# Patient Record
Sex: Female | Born: 1975 | Race: White | Hispanic: No | Marital: Single | Smoking: Current every day smoker
Health system: Southern US, Community
[De-identification: ages and names within clinical notes are randomized; demographics above are authoritative.]

## PROBLEM LIST (undated history)

## (undated) DIAGNOSIS — E282 Polycystic ovarian syndrome: Secondary | ICD-10-CM

## (undated) DIAGNOSIS — R87629 Unspecified abnormal cytological findings in specimens from vagina: Secondary | ICD-10-CM

## (undated) HISTORY — DX: Polycystic ovarian syndrome: E28.2

## (undated) HISTORY — DX: Unspecified abnormal cytological findings in specimens from vagina: R87.629

---

## 1998-08-23 HISTORY — PX: GALLBLADDER SURGERY: SHX652

## 1998-08-23 HISTORY — PX: CHOLECYSTECTOMY: SHX55

## 2012-08-23 HISTORY — PX: HX GASTRIC BYPASS: SHX52

## 2012-08-23 HISTORY — PX: GASTRIC BYPASS: SHX52

## 2019-02-07 ENCOUNTER — Telehealth: Payer: Self-pay | Admitting: General Practice

## 2019-02-07 NOTE — Telephone Encounter (Signed)
Left voicemail for pt to call us back and set up new patient appt.   Copied from [place] [phone]. Topic: Appointment Scheduling - New Patient >> Feb 06, 2019  4:52 PM Yvette Rack wrote: New patient would like to be scheduled for your office. Provider: Pt did not request a specific provider but prefers to establish with a female provider. Pt requests call back to schedule

## 2019-02-14 ENCOUNTER — Ambulatory Visit (INDEPENDENT_AMBULATORY_CARE_PROVIDER_SITE_OTHER): Payer: PRIVATE HEALTH INSURANCE | Admitting: Family Medicine

## 2019-02-14 ENCOUNTER — Encounter (INDEPENDENT_AMBULATORY_CARE_PROVIDER_SITE_OTHER): Payer: Self-pay

## 2019-02-14 ENCOUNTER — Ambulatory Visit (INDEPENDENT_AMBULATORY_CARE_PROVIDER_SITE_OTHER)
Admission: RE | Admit: 2019-02-14 | Discharge: 2019-02-14 | Disposition: A | Discharge status: Home / Self Care | Payer: PRIVATE HEALTH INSURANCE | Source: Ambulatory Visit | Attending: Family Medicine | Admitting: Family Medicine

## 2019-02-14 ENCOUNTER — Other Ambulatory Visit: Payer: Self-pay

## 2019-02-14 ENCOUNTER — Encounter: Payer: Self-pay | Admitting: Family Medicine

## 2019-02-14 VITALS — BP 110/58 | HR 74 | Temp 98.1°F | Resp 16 | Ht 69.0 in | Wt 171.0 lb

## 2019-02-14 DIAGNOSIS — M25531 Pain in right wrist: Secondary | ICD-10-CM | POA: Diagnosis not present

## 2019-02-14 DIAGNOSIS — Z9884 Bariatric surgery status: Secondary | ICD-10-CM | POA: Diagnosis not present

## 2019-02-14 DIAGNOSIS — F411 Generalized anxiety disorder: Secondary | ICD-10-CM | POA: Diagnosis not present

## 2019-02-14 DIAGNOSIS — E282 Polycystic ovarian syndrome: Secondary | ICD-10-CM

## 2019-02-14 DIAGNOSIS — F410 Panic disorder [episodic paroxysmal anxiety] without agoraphobia: Secondary | ICD-10-CM

## 2019-02-14 MED ORDER — SERTRALINE HCL 25 MG PO TABS: 25.0000 mg | ORAL_TABLET | Freq: Every day | ORAL | 3 refills | Status: DC

## 2019-02-14 MED ORDER — SERTRALINE HCL 100 MG PO TABS: 100.0000 mg | ORAL_TABLET | Freq: Every day | ORAL | 3 refills | Status: DC

## 2019-02-14 NOTE — Progress Notes (Signed)
Subjective:     Madeline Cannon is a 43 y.o. female presenting for New Patient Estab Care (No previous PCP or gynecologist/Last Td or Tdap (unsure [phone])2014), Right arm/hand pain numbness (No previous fall injury but ? carpal Tunnel syndrome), and Routine lab/vitamin levels (Hx of gastric bypass in 2014)     HPI   #Anxiety - has been on zoloft for a long time - started for anxiety/panic disorder - feels like things are working pretty well  #tobacco use - down to 1/2 ppd - not interested in quitting - got up to 1 ppd for a few years  #Right arm/hand numbness - digit 3-5 are numb constantly - 2nd digit is swollen and has difficulty bending the finger, has some pain in the finger - feels numbness up til her arm - thinks it may be from working on the computer - started with the 2nd digit pain - feels like it is getting worse with time - puffy and pain with palm area - treatments: soaking in hot water and epson salt - pain medication is limited due to gastric bypass surgery - elevating the arm - no other repetitive motions - can be worse in the morning - no neck or shoulder pain - has noticed some grip strength issues   Review of Systems See HPI  Social History   Tobacco Use  Smoking Status Current Every Day Smoker  . Packs/day: 0.50  . Years: 20.00  . Pack years: 10.00  . Types: Cigarettes  . Start date: 02/13/1997  Smokeless Tobacco Never Used        Objective:    BP Readings from Last 3 Encounters:  02/14/19 (!) 110/58   Wt Readings from Last 3 Encounters:  02/14/19 171 lb (77.6 kg)    BP (!) 110/58 (BP Location: Right Arm, Patient Position: Sitting, Cuff Size: Normal)   Pulse 74   Temp 98.1 F (36.7 C) (Oral)   Resp 16   Ht 5\' 9"  (1.753 m)   Wt 171 lb (77.6 kg)   LMP 02/13/2019 (Exact Date)   SpO2 98%   BMI 25.25 kg/m    Physical Exam Constitutional:      General: She is not in acute distress.    Appearance: She is well-developed. She is  not diaphoretic.  HENT:     Right Ear: External ear normal.     Left Ear: External ear normal.     Nose: Nose normal.  Eyes:     Conjunctiva/sclera: Conjunctivae normal.  Neck:     Musculoskeletal: Neck supple.  Cardiovascular:     Rate and Rhythm: Normal rate.  Pulmonary:     Effort: Pulmonary effort is normal.  Musculoskeletal:     Comments: Right Hand:  Inspection: swelling over the 2nd digit, no rash, no bruising Palpation: TTP over the 2nd digit, mild TTP over the wrist ROM: restricted over the 2nd digit with flexion, otherwise normal but some discomfort with wrist flexion Strength: decreased grip, normal otherwise.  Special: negative elbow tinel sign, some numbness with phalens testing, no numbness with wrist tinel sign.   Skin:    General: Skin is warm and dry.     Capillary Refill: Capillary refill takes less than 2 seconds.  Neurological:     Mental Status: She is alert. Mental status is at baseline.  Psychiatric:        Mood and Affect: Mood normal.        Behavior: Behavior normal.  XR Right hand: no obvious fractures. Possible joint space narrowing. Will f/u final read     Assessment & Plan:   Problem List Items Addressed This Visit      Endocrine   PCOS (polycystic ovarian syndrome)     Other   Generalized anxiety disorder with panic attacks   Relevant Medications   sertraline (ZOLOFT) 100 MG tablet   sertraline (ZOLOFT) 25 MG tablet   Hx of gastric bypass    Other Visit Diagnoses    Right wrist pain    -  Primary   Relevant Orders   DG Hand Complete Right   Ambulatory referral to Physical Therapy       Return in about 6 weeks (around 03/28/2019), or if symptoms worsen or fail to improve, for Dr. Lorelei Pont if not improved.  Lesleigh Noe, MD

## 2019-02-14 NOTE — Assessment & Plan Note (Signed)
Exam without clear diagnosis. Wonder about a combination of arthritis and possible nerve impingement. Unable to illicit elbow nerve signs though the numbness would be more consistent. Discussed trial of brace at night to see if carpal tunnel contributing will improve as well as PT referral given severity of discomfort of the 2nd digit and duration of symptoms. XR obtained given duration of symptoms, though initial review and history lower suspicion for fracture. Will f/u final read.

## 2019-02-14 NOTE — Patient Instructions (Signed)
#  Wrist pain - get a brace to wear at night  - see if this helps with the numbness  - refer to physical therapy

## 2019-02-14 NOTE — Assessment & Plan Note (Signed)
Will obtain outside records and labs to help determine what future monitoring she needs.

## 2019-02-14 NOTE — Assessment & Plan Note (Signed)
Doing well on zoloft, refills provided.

## 2019-02-15 ENCOUNTER — Other Ambulatory Visit: Payer: Self-pay | Admitting: Family Medicine

## 2019-02-15 DIAGNOSIS — Z9884 Bariatric surgery status: Secondary | ICD-10-CM

## 2019-02-15 DIAGNOSIS — Z114 Encounter for screening for human immunodeficiency virus [HIV]: Secondary | ICD-10-CM

## 2019-02-15 DIAGNOSIS — Z13220 Encounter for screening for lipoid disorders: Secondary | ICD-10-CM

## 2019-02-15 DIAGNOSIS — Z8639 Personal history of other endocrine, nutritional and metabolic disease: Secondary | ICD-10-CM

## 2019-02-15 NOTE — Progress Notes (Signed)
Received outside records from the surgeon. Ordered screening labs.   Will also check diabetes test and cholesterol as general screening. And ordered HIV as discussed with patient.   Can schedule lab appointment for blood work when able.

## 2019-02-16 NOTE — Progress Notes (Signed)
Patient advised and appointment made 

## 2019-02-21 NOTE — Addendum Note (Signed)
Addended by: Waunita Schooner R on: 02/21/2019 12:47 PM   Modules accepted: Orders

## 2019-02-22 ENCOUNTER — Other Ambulatory Visit (INDEPENDENT_AMBULATORY_CARE_PROVIDER_SITE_OTHER): Payer: PRIVATE HEALTH INSURANCE

## 2019-02-22 ENCOUNTER — Other Ambulatory Visit: Payer: Self-pay

## 2019-02-22 ENCOUNTER — Other Ambulatory Visit: Payer: PRIVATE HEALTH INSURANCE

## 2019-02-22 DIAGNOSIS — Z13220 Encounter for screening for lipoid disorders: Secondary | ICD-10-CM

## 2019-02-22 DIAGNOSIS — Z8639 Personal history of other endocrine, nutritional and metabolic disease: Secondary | ICD-10-CM

## 2019-02-22 DIAGNOSIS — Z9884 Bariatric surgery status: Secondary | ICD-10-CM | POA: Diagnosis not present

## 2019-02-22 DIAGNOSIS — Z114 Encounter for screening for human immunodeficiency virus [HIV]: Secondary | ICD-10-CM

## 2019-02-22 LAB — IRON: Iron: 8 ug/dL — ABNORMAL LOW (ref 42–145)

## 2019-02-22 LAB — COMPREHENSIVE METABOLIC PANEL
ALT: 4 U/L (ref 0–35)
AST: 9 U/L (ref 0–37)
Albumin: 3.9 g/dL (ref 3.5–5.2)
Alkaline Phosphatase: 72 U/L (ref 39–117)
BUN: 10 mg/dL (ref 6–23)
CO2: 27 mEq/L (ref 19–32)
Calcium: 8.5 mg/dL (ref 8.4–10.5)
Chloride: 106 mEq/L (ref 96–112)
Creatinine, Ser: 0.54 mg/dL (ref 0.40–1.20)
GFR: 124.41 mL/min (ref >60.00)
Glucose, Bld: 83 mg/dL (ref 70–99)
Potassium: 4.4 mEq/L (ref 3.5–5.1)
Sodium: 139 mEq/L (ref 135–145)
Total Bilirubin: 0.4 mg/dL (ref 0.2–1.2)
Total Protein: 6.4 g/dL (ref 6.0–8.3)

## 2019-02-22 LAB — LIPID PANEL
Cholesterol: 112 mg/dL (ref 0–200)
HDL: 35.3 mg/dL — ABNORMAL LOW (ref >39.00)
LDL Cholesterol: 67 mg/dL (ref 0–99)
NonHDL: 77.07
Total CHOL/HDL Ratio: 3
Triglycerides: 51 mg/dL (ref 0.0–149.0)
VLDL: 10.2 mg/dL (ref 0.0–40.0)

## 2019-02-22 LAB — VITAMIN D 25 HYDROXY (VIT D DEFICIENCY, FRACTURES): VITD: 14.72 ng/mL — ABNORMAL LOW (ref 30.00–100.00)

## 2019-02-22 LAB — FERRITIN: Ferritin: 1.2 ng/mL — ABNORMAL LOW (ref 10.0–291.0)

## 2019-02-22 LAB — VITAMIN B12: Vitamin B-12: 56 pg/mL — ABNORMAL LOW (ref 211–911)

## 2019-02-22 LAB — HEMOGLOBIN A1C: Hemoglobin A1C: 5.4

## 2019-02-22 LAB — FOLATE: Folate: 8 ng/mL (ref >5.9)

## 2019-02-26 ENCOUNTER — Telehealth: Payer: Self-pay | Admitting: Family Medicine

## 2019-02-26 DIAGNOSIS — Z9884 Bariatric surgery status: Secondary | ICD-10-CM

## 2019-02-26 DIAGNOSIS — E611 Iron deficiency: Secondary | ICD-10-CM

## 2019-02-26 DIAGNOSIS — E538 Deficiency of other specified B group vitamins: Secondary | ICD-10-CM

## 2019-02-26 DIAGNOSIS — E559 Vitamin D deficiency, unspecified: Secondary | ICD-10-CM

## 2019-02-26 DIAGNOSIS — E519 Thiamine deficiency, unspecified: Secondary | ICD-10-CM

## 2019-02-26 LAB — HIV ANTIBODY (ROUTINE TESTING W REFLEX): HIV 1&2 Ab, 4th Generation: NONREACTIVE

## 2019-02-26 LAB — VITAMIN B1: Vitamin B1 (Thiamine): <6 nmol/L — ABNORMAL LOW (ref 8–30)

## 2019-02-26 LAB — VITAMIN B6: Vitamin B6: 2.4 ng/mL (ref 2.1–21.7)

## 2019-02-26 MED ORDER — B COMPLEX-C PO TABS: 1.0000 | ORAL_TABLET | Freq: Every day | ORAL | 3 refills | Status: DC

## 2019-02-26 MED ORDER — FERROUS SULFATE 324 (65 FE) MG PO TBEC: 1.0000 | DELAYED_RELEASE_TABLET | Freq: Every day | ORAL | 1 refills | Status: DC

## 2019-02-26 MED ORDER — CYANOCOBALAMIN 500 MCG PO TABS: 500.0000 ug | ORAL_TABLET | Freq: Every day | ORAL | 3 refills | Status: DC

## 2019-02-26 MED ORDER — VITAMIN D (ERGOCALCIFEROL) 1.25 MG (50000 UNIT) PO CAPS: 50000.0000 [IU] | ORAL_CAPSULE | ORAL | 1 refills | Status: DC

## 2019-02-26 NOTE — Telephone Encounter (Signed)
Called to discuss lab results  1. Vitamin B12 deficienc - vitamin B-12 (CYANOCOBALAMIN) 500 MCG tablet; Take 1 tablet (500 mcg total) by mouth daily.  Dispense: 90 tablet; Refill: 3 - Vitamin B12; Future  Repeat test in 2-3 weeks to assess response  2. Iron deficiency - ferrous sulfate 324 (65 Fe) MG TBEC; Take 1 tablet (325 mg total) by mouth daily.  Dispense: 90 tablet; Refill: 1  3. Thiamine deficiency - B Complex-C (B-COMPLEX WITH VITAMIN C) tablet; Take 1 tablet by mouth daily.  Dispense: 90 tablet; Refill: 3  4. Vitamin D deficiency - advised daily supplement after completion of 8 weeks of replacement - Vitamin D, Ergocalciferol, (DRISDOL) 1.25 MG (50000 UT) CAPS capsule; Take 1 capsule (50,000 Units total) by mouth every 7 (seven) days.  Dispense: 4 capsule; Refill: 1  5. Hx of gastric bypass - vitamin B-12 (CYANOCOBALAMIN) 500 MCG tablet; Take 1 tablet (500 mcg total) by mouth daily.  Dispense: 90 tablet; Refill: 3 - B Complex-C (B-COMPLEX WITH VITAMIN C) tablet; Take 1 tablet by mouth daily.  Dispense: 90 tablet; Refill: 3 - ferrous sulfate 324 (65 Fe) MG TBEC; Take 1 tablet (325 mg total) by mouth daily.  Dispense: 90 tablet; Refill: 1 - Vitamin D, Ergocalciferol, (DRISDOL) 1.25 MG (50000 UT) CAPS capsule; Take 1 capsule (50,000 Units total) by mouth every 7 (seven) days.  Dispense: 4 capsule; Refill: 1 - Vitamin B12; Future

## 2019-03-06 LAB — HEMOGLOBIN A1C
Hgb A1c MFr Bld: 5 % of total Hgb (ref <5.7)
Mean Plasma Glucose: 97 (calc)
eAG (mmol/L): 5.4 (calc)

## 2019-03-09 ENCOUNTER — Ambulatory Visit: Payer: PRIVATE HEALTH INSURANCE | Admitting: Physical Therapy

## 2019-03-23 ENCOUNTER — Ambulatory Visit: Payer: PRIVATE HEALTH INSURANCE | Attending: Physical Therapy | Admitting: Physical Therapy

## 2019-05-03 ENCOUNTER — Telehealth: Payer: Self-pay

## 2019-05-03 NOTE — Telephone Encounter (Signed)
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 Pt said she has not had a flushot in 8 years;when pt took the last flushot she is not sure if was allergic reaction or not but had a major panic attack and pt does not want to take another flu shot. Pt will ck with the school program and see if they will allow pt to go to school without a flu shot. Pt will cb if needed.

## 2019-05-11 ENCOUNTER — Ambulatory Visit: Payer: PRIVATE HEALTH INSURANCE | Admitting: Family Medicine

## 2019-06-20 ENCOUNTER — Other Ambulatory Visit: Payer: Self-pay | Admitting: Family Medicine

## 2019-06-20 DIAGNOSIS — E519 Thiamine deficiency, unspecified: Secondary | ICD-10-CM

## 2019-06-20 DIAGNOSIS — Z9884 Bariatric surgery status: Secondary | ICD-10-CM

## 2019-06-20 DIAGNOSIS — E611 Iron deficiency: Secondary | ICD-10-CM

## 2019-06-20 DIAGNOSIS — E559 Vitamin D deficiency, unspecified: Secondary | ICD-10-CM

## 2019-06-20 DIAGNOSIS — E538 Deficiency of other specified B group vitamins: Secondary | ICD-10-CM

## 2019-06-20 NOTE — Progress Notes (Signed)
Ordered follow-up labs from 02/22/2019 for deficiencies

## 2019-06-28 ENCOUNTER — Other Ambulatory Visit: Payer: PRIVATE HEALTH INSURANCE

## 2019-07-03 ENCOUNTER — Encounter: Payer: PRIVATE HEALTH INSURANCE | Admitting: Family Medicine

## 2019-07-05 ENCOUNTER — Encounter: Payer: PRIVATE HEALTH INSURANCE | Admitting: Family Medicine

## 2019-07-05 ENCOUNTER — Other Ambulatory Visit (HOSPITAL_COMMUNITY)
Admission: RE | Admit: 2019-07-05 | Discharge: 2019-07-05 | Disposition: A | Discharge status: Home / Self Care | Payer: PRIVATE HEALTH INSURANCE | Source: Ambulatory Visit | Attending: Family Medicine | Admitting: Family Medicine

## 2019-07-05 ENCOUNTER — Ambulatory Visit (INDEPENDENT_AMBULATORY_CARE_PROVIDER_SITE_OTHER): Payer: PRIVATE HEALTH INSURANCE | Admitting: Family Medicine

## 2019-07-05 ENCOUNTER — Encounter: Payer: Self-pay | Admitting: Family Medicine

## 2019-07-05 ENCOUNTER — Other Ambulatory Visit: Payer: Self-pay

## 2019-07-05 VITALS — BP 118/64 | HR 83 | Temp 98.6°F | Ht 69.5 in | Wt 168.5 lb

## 2019-07-05 DIAGNOSIS — E538 Deficiency of other specified B group vitamins: Secondary | ICD-10-CM

## 2019-07-05 DIAGNOSIS — Z9884 Bariatric surgery status: Secondary | ICD-10-CM

## 2019-07-05 DIAGNOSIS — Z124 Encounter for screening for malignant neoplasm of cervix: Secondary | ICD-10-CM | POA: Diagnosis present

## 2019-07-05 DIAGNOSIS — E519 Thiamine deficiency, unspecified: Secondary | ICD-10-CM

## 2019-07-05 DIAGNOSIS — Z0000 Encounter for general adult medical examination without abnormal findings: Secondary | ICD-10-CM | POA: Diagnosis not present

## 2019-07-05 DIAGNOSIS — E611 Iron deficiency: Secondary | ICD-10-CM | POA: Diagnosis not present

## 2019-07-05 DIAGNOSIS — E559 Vitamin D deficiency, unspecified: Secondary | ICD-10-CM | POA: Diagnosis not present

## 2019-07-05 NOTE — Progress Notes (Signed)
Annual Exam   Chief Complaint:  Chief Complaint  Patient presents with  . Annual Exam    History of Present Illness:  [name] is a 43 y.o. No obstetric history on file. who LMP was Patient's last menstrual period was 06/12/2019.presents today for her annual examination.     Nutrition She does not get adequate calcium and Vitamin D in her diet. Diet: not great - gets a variety - low veggies, snacking on chips and dip Exercise: not currently  Safety The patient wears seatbelts: yes.     The patient feels safe at home and in their relationships: yes.   Menstrual Her menses are regular every 28-30 days, lasting 5 day(s).   Dysmenorrhea none. She does not have intermenstrual bleeding.  GYN She is not sexually active.  Hx of STDs: none She does not want STI testing today.   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 There is FH of ovarian cancer. BRCA screening Not Indicated.  Discussed that for average risk women between age 63-49 screening may reduce the risk of breast cancer death, however, at a lower rate than those over age 68. And that the the false-positive rates resulting in unnecessary biopsies with more screening is higher. The balance of benefits vs harms likely improves as you progress through your 40s. The patient does not want a mammogram this year.   Weight Wt Readings from Last 3 Encounters:  07/05/19 168 lb 8 oz (76.4 kg)  02/14/19 171 lb (77.6 kg)   Patient has normal BMI  BMI Readings from Last 1 Encounters:  07/05/19 24.53 kg/m     Chronic disease screening Blood pressure monitoring:  BP Readings from Last 3 Encounters:  07/05/19 118/64  02/14/19 (!) 110/58    Lipid Monitoring: Indication for screening: age >34, obesity, diabetes, family hx, CV risk factors.  Lipid screening: Not Indicated  Lab Results  Component Value  Date   CHOL 112 02/22/2019   HDL 35.30 (L) 02/22/2019   LDLCALC 67 02/22/2019   TRIG 51.0 02/22/2019   CHOLHDL 3 02/22/2019     Diabetes Screening: age >12, overweight, family hx, PCOS, hx of gestational diabetes, at risk ethnicity Diabetes Screening screening: Not Indicated  Lab Results  Component Value Date   HGBA1C 5.0 02/22/2019     No past medical history on file.  Past Surgical History:  Procedure Laterality Date  . CHOLECYSTECTOMY  2000  . GASTRIC BYPASS  2014    Prior to Admission medications   Medication Sig Start Date End Date Taking? Authorizing Provider  B Complex-C (B-COMPLEX WITH VITAMIN C) tablet Take 1 tablet by mouth daily. 02/26/19   Lesleigh Noe, MD  ferrous sulfate 324 (65 Fe) MG TBEC Take 1 tablet (325 mg total) by mouth daily. 02/26/19   Lesleigh Noe, MD  Multiple Vitamin (MULTIVITAMIN) tablet Take 1 tablet by mouth daily.    [provider]  sertraline (ZOLOFT) 100 MG tablet Take 1 tablet (100 mg total) by mouth daily. 02/14/19   Lesleigh Noe, MD  sertraline (ZOLOFT) 25 MG tablet Take 1 tablet (25 mg total) by mouth daily. 02/14/19   Lesleigh Noe, MD  vitamin B-12 (CYANOCOBALAMIN) 500 MCG tablet Take 1 tablet (500 mcg total) by mouth daily. 02/26/19   Lesleigh Noe, MD  Vitamin D, Ergocalciferol, (DRISDOL) 1.25 MG (50000  UT) CAPS capsule Take 1 capsule (50,000 Units total) by mouth every 7 (seven) days. 02/26/19   Lesleigh Noe, MD    No Known Allergies  Gynecologic History: Patient's last menstrual period was 06/12/2019.  Obstetric History: No obstetric history on file.  Social History   Socioeconomic History  . Marital status: Single    Spouse name: Roger Shelter  . Number of children: Not on file  . Years of education: bachelors  . Highest education level: Not on file  Occupational History  . Not on file  Social Needs  . Financial resource strain: Not hard at all  . Food insecurity    Worry: Not on file    Inability:  Not on file  . Transportation needs    Medical: Not on file    Non-medical: Not on file  Tobacco Use  . Smoking status: Current Every Day Smoker    Packs/day: 0.50    Years: 20.00    Pack years: 10.00    Types: Cigarettes    Start date: 02/13/1997  . Smokeless tobacco: Never Used  Substance and Sexual Activity  . Alcohol use: Never    Frequency: Never  . Drug use: Never  . Sexual activity: Yes    Birth control/protection: None  Lifestyle  . Physical activity    Days per week: Not on file    Minutes per session: Not on file  . Stress: Not on file  Relationships  . Social Herbalist on phone: Not on file    Gets together: Not on file    Attends religious service: Not on file    Active member of club or organization: Not on file    Attends meetings of clubs or organizations: Not on file    Relationship status: Not on file  . Intimate partner violence    Fear of current or ex partner: Not on file    Emotionally abused: Not on file    Physically abused: Not on file    Forced sexual activity: Not on file  Other Topics Concern  . Not on file  Social History Narrative   02/14/19   From: moved to be closer to finance, from [place], [place]   Living: with [place]   Work: [employer] service      Family: close with parents - mom and brother in [place] and dad in [place]      Enjoys: spending time with finance family, get away road trips with [place]      Exercise: not really   Diet: too much sugar, tries to eat healthy      Safety   Seat belts: Yes    Guns: No   Safe in relationships: Yes     Family History  Problem Relation Age of Onset  . Diabetes Father   . Hypertension Father   . Hyperlipidemia Father   . Arthritis Maternal Grandmother   . Stomach cancer Maternal Grandmother   . Asthma Maternal Grandfather   . Diabetes Maternal Grandfather   . Heart attack Maternal Grandfather 80  . Heart disease Maternal Grandfather   . Hypertension Maternal  Grandfather   . Depression Paternal Grandmother   . Hypertension Paternal Grandmother   . Hyperlipidemia Paternal Grandmother   . Diabetes Paternal Grandmother   . Depression Paternal Grandfather     Review of Systems  Constitutional: Negative for chills and fever.  HENT: Negative.   Eyes: Negative.   Respiratory: Negative.  Cardiovascular: Negative.   Gastrointestinal:       Hemorrhoids  Genitourinary: Negative.   Musculoskeletal: Negative.   Skin: Negative.   Neurological: Negative.   Endo/Heme/Allergies: Negative.   Psychiatric/Behavioral: Negative for depression. The patient is not nervous/anxious.      Physical Exam BP 118/64   Pulse 83   Temp 98.6 F (37 C)   Ht 5' 9.5" (1.765 m)   Wt 168 lb 8 oz (76.4 kg)   LMP 06/12/2019   SpO2 100%   BMI 24.53 kg/m    BP Readings from Last 3 Encounters:  07/05/19 118/64  02/14/19 (!) 110/58      Physical Exam Exam conducted with a chaperone present.  Constitutional:      General: She is not in acute distress.    Appearance: She is well-developed. She is not diaphoretic.  HENT:     Head: Normocephalic and atraumatic.     Right Ear: External ear normal.     Left Ear: External ear normal.     Nose: Nose normal.  Eyes:     General: No scleral icterus.    Conjunctiva/sclera: Conjunctivae normal.  Neck:     Musculoskeletal: Neck supple.  Cardiovascular:     Rate and Rhythm: Normal rate and regular rhythm.     Heart sounds: No murmur.  Pulmonary:     Effort: Pulmonary effort is normal. No respiratory distress.     Breath sounds: Normal breath sounds. No wheezing.  Abdominal:     General: Bowel sounds are normal. There is no distension.     Palpations: Abdomen is soft. There is no mass.     Tenderness: There is no abdominal tenderness. There is no guarding or rebound.  Genitourinary:    Pubic Area: No rash.      Cervix: Lesion (some redundant tissue at the os) present.  Musculoskeletal: Normal range of motion.   Lymphadenopathy:     Cervical: No cervical adenopathy.  Skin:    General: Skin is warm and dry.     Capillary Refill: Capillary refill takes less than 2 seconds.  Neurological:     Mental Status: She is alert and oriented to person, place, and time.     Deep Tendon Reflexes: Reflexes normal.  Psychiatric:        Behavior: Behavior normal.      Results:  PHQ-9:    Office Visit from 02/14/2019 in [place] at [facility]  PHQ-9 Total Score  2        Assessment: 43 y.o. No obstetric history on file. female here for routine annual physical examination.  Plan: Problem List Items Addressed This Visit      Other   Hx of gastric bypass    Other Visit Diagnoses    Encounter for annual health examination    -  Primary   Cervical cancer screening       Relevant Orders   Cytology - PAP([place])   Vitamin B12 deficiency       Thiamine deficiency       Vitamin D deficiency       Iron deficiency          Screening: -- Blood pressure screen normal -- cholesterol screening: not due for screening -- Weight screening: normal -- Diabetes Screening: not due for screening -- Nutrition: Encouraged adding more vegetables to her diet. As well as calcium for bone health  The ASCVD Risk score Mikey Bussing DC Jr., et al., 2013) failed to calculate for  the following reasons:   The valid total cholesterol range is 130 to 320 mg/dL  -- Statin therapy for Age 63-75 with CVD risk >7.5%  Psych -- Depression screening (PHQ-9):    Office Visit from 02/14/2019 in [place] at [facility]  PHQ-9 Total Score  2       Safety -- tobacco screening: not using -- alcohol screening:  low-risk usage. -- no evidence of domestic violence or intimate partner violence. -- STD screening: gonorrhea/chlamydia NAAT not collected  Cancer Screening -- pap smear collected per ASCCP guidelines -- family history of breast cancer screening: done.Discussed grandmother with ovarian cancer  is slightly higher risk, but patient would like to wait on mammogram -- Mammogram - not indicated  Immunizations -- flu vaccine advised, but pt declined -- TDAP q10 years up to date     Lesleigh Noe, MD

## 2019-07-05 NOTE — Patient Instructions (Signed)
You have a Hemorrhoid  Here are ways to prevent future issues and to treat your hemorrhoid 1) Make sure you get 25-35 g of INSOLUBLE fiber a day -- ideally from diet but you can also take a fiber supplement like Metamucil 2) Drink 1.5 to 2 liters of water daily  3) Avoid straining and prolonged periods on the toilet 4) Limit fatty foods and alcohol 5) Try to lose weight  How to care for your hemorrhoid now 1) Sitz Baths and warm water sprays 2) Wipe with a wet wipe or flushable wipe  3) Consider using stool softeners (Like Colace or Miralax) 4) Topical Hemorrhoid cream (Preparation H is available over the counter)  5) Steroid suppository - though can be expensive  Let me know if no improvement in 6 weeks -- you may need surgery     Preventive Care 87-65 Years Old, Female Preventive care refers to visits with your health care provider and lifestyle choices that can promote health and wellness. This includes:  A yearly physical exam. This may also be called an annual well check.  Regular dental visits and eye exams.  Immunizations.  Screening for certain conditions.  Healthy lifestyle choices, such as eating a healthy diet, getting regular exercise, not using drugs or products that contain nicotine and tobacco, and limiting alcohol use. What can I expect for my preventive care visit? Physical exam Your health care provider will check your:  Height and weight. This may be used to calculate body mass index (BMI), which tells if you are at a healthy weight.  Heart rate and blood pressure.  Skin for abnormal spots. Counseling Your health care provider may ask you questions about your:  Alcohol, tobacco, and drug use.  Emotional well-being.  Home and relationship well-being.  Sexual activity.  Eating habits.  Work and work Statistician.  Method of birth control.  Menstrual cycle.  Pregnancy history. What immunizations do I need?  Influenza (flu) vaccine  This  is recommended every year. Tetanus, diphtheria, and pertussis (Tdap) vaccine  You may need a Td booster every 10 years. Varicella (chickenpox) vaccine  You may need this if you have not been vaccinated. Zoster (shingles) vaccine  You may need this after age 67. Measles, mumps, and rubella (MMR) vaccine  You may need at least one dose of MMR if you were born in 1957 or later. You may also need a second dose. Pneumococcal conjugate (PCV13) vaccine  You may need this if you have certain conditions and were not previously vaccinated. Pneumococcal polysaccharide (PPSV23) vaccine  You may need one or two doses if you smoke cigarettes or if you have certain conditions. Meningococcal conjugate (MenACWY) vaccine  You may need this if you have certain conditions. Hepatitis A vaccine  You may need this if you have certain conditions or if you travel or work in places where you may be exposed to hepatitis A. Hepatitis B vaccine  You may need this if you have certain conditions or if you travel or work in places where you may be exposed to hepatitis B. Haemophilus influenzae type b (Hib) vaccine  You may need this if you have certain conditions. Human papillomavirus (HPV) vaccine  If recommended by your health care provider, you may need three doses over 6 months. You may receive vaccines as individual doses or as more than one vaccine together in one shot (combination vaccines). Talk with your health care provider about the risks and benefits of combination vaccines. What tests do  I need? Blood tests  Lipid and cholesterol levels. These may be checked every 5 years, or more frequently if you are over 14 years old.  Hepatitis C test.  Hepatitis B test. Screening  Lung cancer screening. You may have this screening every year starting at age 45 if you have a 30-pack-year history of smoking and currently smoke or have quit within the past 15 years.  Colorectal cancer screening. All  adults should have this screening starting at age 56 and continuing until age 42. Your health care provider may recommend screening at age 17 if you are at increased risk. You will have tests every 1-10 years, depending on your results and the type of screening test.  Diabetes screening. This is done by checking your blood sugar (glucose) after you have not eaten for a while (fasting). You may have this done every 1-3 years.  Mammogram. This may be done every 1-2 years. Talk with your health care provider about when you should start having regular mammograms. This may depend on whether you have a family history of breast cancer.  BRCA-related cancer screening. This may be done if you have a family history of breast, ovarian, tubal, or peritoneal cancers.  Pelvic exam and Pap test. This may be done every 3 years starting at age 15. Starting at age 32, this may be done every 5 years if you have a Pap test in combination with an HPV test. Other tests  Sexually transmitted disease (STD) testing.  Bone density scan. This is done to screen for osteoporosis. You may have this scan if you are at high risk for osteoporosis. Follow these instructions at home: Eating and drinking  Eat a diet that includes fresh fruits and vegetables, whole grains, lean protein, and low-fat dairy.  Take vitamin and mineral supplements as recommended by your health care provider.  Do not drink alcohol if: ? Your health care provider tells you not to drink. ? You are pregnant, may be pregnant, or are planning to become pregnant.  If you drink alcohol: ? Limit how much you have to 0-1 drink a day. ? Be aware of how much alcohol is in your drink. In the [place] one drink equals one 12 oz bottle of beer (355 mL), one 5 oz glass of wine (148 mL), or one 1 oz glass of hard liquor (44 mL). Lifestyle  Take daily care of your teeth and gums.  Stay active. Exercise for at least 30 minutes on 5 or more days each week.  Do  not use any products that contain nicotine or tobacco, such as cigarettes, e-cigarettes, and chewing tobacco. If you need help quitting, ask your health care provider.  If you are sexually active, practice safe sex. Use a condom or other form of birth control (contraception) in order to prevent pregnancy and STIs (sexually transmitted infections).  If told by your health care provider, take low-dose aspirin daily starting at age 3. What's next?  Visit your health care provider once a year for a well check visit.  Ask your health care provider how often you should have your eyes and teeth checked.  Stay up to date on all vaccines. This information is not intended to replace advice given to you by your health care provider. Make sure you discuss any questions you have with your health care provider. Document Released: 09/05/2015 Document Revised: 04/20/2018 Document Reviewed: 04/20/2018 Elsevier Patient Education  2020 [employer].

## 2019-07-06 ENCOUNTER — Telehealth: Payer: Self-pay

## 2019-07-06 LAB — VITAMIN B12: Vitamin B-12: 77 pg/mL — ABNORMAL LOW (ref 211–911)

## 2019-07-06 LAB — VITAMIN D 25 HYDROXY (VIT D DEFICIENCY, FRACTURES): VITD: 14.66 ng/mL — ABNORMAL LOW (ref 30.00–100.00)

## 2019-07-06 LAB — CBC
HCT: 18.5 % — CL (ref 36.0–46.0)
Hemoglobin: 5 g/dL — CL (ref 12.0–15.0)
MCHC: 27.3 g/dL — ABNORMAL LOW (ref 30.0–36.0)
MCV: 53.2 fl — ABNORMAL LOW (ref 78.0–100.0)
Platelets: 400 10*3/uL (ref 150.0–400.0)
RBC: 3.47 Mil/uL — ABNORMAL LOW (ref 3.87–5.11)
RDW: 21.1 % — ABNORMAL HIGH (ref 11.5–15.5)
WBC: 5.4 10*3/uL (ref 4.0–10.5)

## 2019-07-06 LAB — IRON: Iron: 8 ug/dL — ABNORMAL LOW (ref 42–145)

## 2019-07-06 LAB — FERRITIN: Ferritin: 1.2 ng/mL — ABNORMAL LOW (ref 10.0–291.0)

## 2019-07-06 NOTE — Telephone Encounter (Signed)
Appreciate the care coordination.

## 2019-07-06 NOTE — Telephone Encounter (Signed)
Critical Result called from [facility] @ 1100  Hemoglobin 5 Hematocrit 18.5

## 2019-07-06 NOTE — Telephone Encounter (Signed)
Patient advised and verbalized understanding. Patient states she is not having any abnormal bleeding and is not having any issues. She will go to ER but not sure which location. Advised patient we would be able to see notes from the hospital  In epic.

## 2019-07-06 NOTE — Telephone Encounter (Signed)
Reviewed note.. pt asymptomatic at OV yesterday ( hx of iron def anemia, only hemorrhoids noted) but given Hg 5 (critically low)... please call pt ASAP and instruct her to go to ER for transfusion.

## 2019-07-09 ENCOUNTER — Encounter: Payer: Self-pay | Admitting: Family Medicine

## 2019-07-09 ENCOUNTER — Other Ambulatory Visit: Payer: Self-pay | Admitting: Family Medicine

## 2019-07-09 ENCOUNTER — Telehealth: Payer: Self-pay | Admitting: Family Medicine

## 2019-07-09 DIAGNOSIS — D508 Other iron deficiency anemias: Secondary | ICD-10-CM

## 2019-07-09 DIAGNOSIS — E538 Deficiency of other specified B group vitamins: Secondary | ICD-10-CM

## 2019-07-09 DIAGNOSIS — D509 Iron deficiency anemia, unspecified: Secondary | ICD-10-CM | POA: Insufficient documentation

## 2019-07-09 LAB — CYTOLOGY - PAP
Comment: NEGATIVE
Diagnosis: NEGATIVE
High risk HPV: POSITIVE — AB

## 2019-07-09 LAB — VITAMIN B1: Vitamin B1 (Thiamine): <6 nmol/L — ABNORMAL LOW (ref 8–30)

## 2019-07-09 NOTE — Progress Notes (Signed)
Given severity of anemia will refer to GI for further evaluation. MyChart to patient in a different encounter to let her know.

## 2019-07-09 NOTE — Telephone Encounter (Signed)
Spoke with patient and Dr Einar Pheasant. Iron infusion set up for first available which is 07/13/2019 at 8 am. Patient advised to go to [facility] ER entrance and go to admission and they will contact the person that needs to come and get patient for this to be done. Short Stay phone number is [phone].  Patient was advised that if she started to develop new symptoms or worse symptoms before that appointment that she needs to go to ER right away. Patient verbalized understanding.

## 2019-07-09 NOTE — Telephone Encounter (Signed)
Following up and do not see that she went to the ER.   Routing to MA to reach out to patient and check in on symptoms.   Can look into outpatient infusion.   Madeline Cannon

## 2019-07-09 NOTE — Progress Notes (Signed)
Please schedule patient for B12 injections.

## 2019-07-09 NOTE — Addendum Note (Signed)
Addended by: Waunita Schooner R on: 07/09/2019 02:12 PM   Modules accepted: Orders

## 2019-07-09 NOTE — Telephone Encounter (Signed)
Reached patient by phone.   Did not go to the hospital Every once in a while - feels faint, tired, the corner of her mouth is cracked open.   No fainting. No SOB  Over the weekend - she took 2 iron pills and rested this weekend and ate high iron food.   Vitamins prescribed 4 months ago - only   No blood in stool Periods - using tampons - changing out 3-4 times a day and between super and regular - not saturated  Will do Iron infusion  Declines blood at this time  Will also do B-12 shots  Will start taking B-Complex daily

## 2019-07-09 NOTE — Progress Notes (Signed)
Verified with Dr Einar Pheasant on how often she wants patient to have B12 injections. Per Dr Einar Pheasant- b12 injections once a week for 4 weeks and then once monthly there after. Patient advised and is scheduled to start in December due to her and our availability.

## 2019-07-10 ENCOUNTER — Encounter: Payer: Self-pay | Admitting: Family Medicine

## 2019-07-11 ENCOUNTER — Encounter: Payer: Self-pay | Admitting: Family Medicine

## 2019-07-11 ENCOUNTER — Ambulatory Visit: Payer: PRIVATE HEALTH INSURANCE

## 2019-07-11 NOTE — Telephone Encounter (Signed)
Pt to c/b after she contacts her insurance, [employer], to see where she is able to go for her G.I. referral.

## 2019-07-11 NOTE — Telephone Encounter (Signed)
Pt left v/m with the information in the my chart note. I spoke with pt and she has gotten the mychart note from Dr Einar Pheasant and she will have infusion. Pt has not heard about GI referral and wants to know who she is supposed to see. [facility] will speak with pt.

## 2019-07-12 ENCOUNTER — Other Ambulatory Visit (HOSPITAL_COMMUNITY): Payer: Self-pay | Admitting: *Deleted

## 2019-07-13 ENCOUNTER — Ambulatory Visit (HOSPITAL_COMMUNITY)
Admission: RE | Admit: 2019-07-13 | Discharge: 2019-07-13 | Disposition: A | Discharge status: Home / Self Care | Payer: PRIVATE HEALTH INSURANCE | Source: Ambulatory Visit | Attending: Family Medicine | Admitting: Family Medicine

## 2019-07-13 ENCOUNTER — Other Ambulatory Visit: Payer: Self-pay

## 2019-07-13 DIAGNOSIS — D509 Iron deficiency anemia, unspecified: Secondary | ICD-10-CM | POA: Diagnosis present

## 2019-07-13 MED ORDER — SODIUM CHLORIDE 0.9 % IV SOLN
510.0000 mg | INTRAVENOUS | Status: DC
  Administered 2019-07-13: 510 mg via INTRAVENOUS
  Filled 2019-07-13: qty 510

## 2019-07-17 ENCOUNTER — Other Ambulatory Visit: Payer: Self-pay

## 2019-07-17 ENCOUNTER — Encounter: Payer: Self-pay | Admitting: Family Medicine

## 2019-07-17 ENCOUNTER — Encounter: Payer: Self-pay | Admitting: Gastroenterology

## 2019-07-17 ENCOUNTER — Ambulatory Visit (INDEPENDENT_AMBULATORY_CARE_PROVIDER_SITE_OTHER): Payer: PRIVATE HEALTH INSURANCE | Admitting: Gastroenterology

## 2019-07-17 ENCOUNTER — Telehealth: Payer: Self-pay | Admitting: Gastroenterology

## 2019-07-17 VITALS — BP 113/69 | HR 79 | Temp 98.5°F | Wt 171.0 lb

## 2019-07-17 DIAGNOSIS — D509 Iron deficiency anemia, unspecified: Secondary | ICD-10-CM

## 2019-07-17 MED ORDER — SODIUM CHLORIDE 0.9% IV SOLUTION: Freq: Once | INTRAVENOUS | Status: DC

## 2019-07-17 NOTE — Telephone Encounter (Signed)
Hassan Rowan from cancer center is calling to let Caryl Pina know pt is scheduled for 07/18/19 with Dr. Yevette Edwards

## 2019-07-17 NOTE — Progress Notes (Signed)
Madeline Cannon [address]  [place]  [place], [place] [address]  Main: [phone]  Fax: [phone]   Gastroenterology Consultation  Referring Provider:     Lesleigh Noe, MD Primary Care Physician:  Lesleigh Noe, MD Reason for Consultation:     Iron deficiency anemia       HPI:    Chief Complaint  Patient presents with  . New Patient (Initial Visit)  . 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 Patient states she had a iron infusion this past week     Madeline Cannon is a 43 y.o. y/o female referred for consultation & management  by Dr. Einar Pheasant, Jobe Marker, MD.  Patient with history of gastric bypass, referred for iron deficiency anemia.  Hemoglobin on November 12 done by PCP was 5.  Low MCV.  IV iron transfusions were ordered as an outpatient by primary care provider.  Patient has received 1 of these so far.  Packed red blood cell transfusion has not been done, as patient did not want it done as she was afraid that she would have to get admitted to the hospital for it.  No evidence of bleeding from any source.  Does report that she feels like eating ice all the time and this has been ongoing for years.  I do not have any previous CBC to compare this to.  Past medical history: Gastric bypass  Past Surgical History:  Procedure Laterality Date  . CHOLECYSTECTOMY  2000  . GASTRIC BYPASS  2014    Prior to Admission medications   Medication Sig Start Date End Date Taking? Authorizing Provider  B Complex-C (B-COMPLEX WITH VITAMIN C) tablet Take 1 tablet by mouth daily. 02/26/19  Yes Lesleigh Noe, MD  Multiple Vitamin (MULTIVITAMIN) tablet Take 1 tablet by mouth daily.   Yes [provider]  Nutritional Supplements (VITAMIN D BOOSTER PO) Take by mouth.   Yes [provider]  sertraline (ZOLOFT) 100 MG tablet Take 1 tablet (100 mg total) by mouth daily. 02/14/19  Yes Lesleigh Noe, MD  sertraline (ZOLOFT) 25 MG tablet Take 1  tablet (25 mg total) by mouth daily. 02/14/19  Yes Lesleigh Noe, MD  vitamin B-12 (CYANOCOBALAMIN) 500 MCG tablet Take 1 tablet (500 mcg total) by mouth daily. 02/26/19  Yes Lesleigh Noe, MD    Family History  Problem Relation Age of Onset  . Diabetes Father   . Hypertension Father   . Hyperlipidemia Father   . Arthritis Maternal Grandmother   . Stomach cancer Maternal Grandmother   . Asthma Maternal Grandfather   . Diabetes Maternal Grandfather   . Heart attack Maternal Grandfather 80  . Heart disease Maternal Grandfather   . Hypertension Maternal Grandfather   . Depression Paternal Grandmother   . Hypertension Paternal Grandmother   . Hyperlipidemia Paternal Grandmother   . Diabetes Paternal Grandmother   . Depression Paternal Grandfather      Social History   Tobacco Use  . Smoking status: Current Every Day Smoker    Packs/day: 0.50    Years: 20.00    Pack years: 10.00    Types: Cigarettes    Start date: 02/13/1997  . Smokeless tobacco: Never Used  Substance Use Topics  . Alcohol use: Never    Frequency: Never  . Drug use: Never    Allergies as of 07/17/2019  . (No Known Allergies)    Review of Systems:  All systems reviewed and negative except where noted in HPI.   Physical Exam:  BP 113/69 (BP Location: Left Arm, Patient Position: Sitting, Cuff Size: Normal)   Pulse 79   Temp 98.5 F (36.9 C) (Oral)   Wt 171 lb (77.6 kg)   BMI 25.25 kg/m  No LMP recorded. Psych:  Alert and cooperative. Normal mood and affect. General:   Alert,  Well-developed, well-nourished, pleasant and cooperative in NAD Head:  Normocephalic and atraumatic. Eyes:  Sclera clear, no icterus.   Conjunctiva pink. Ears:  Normal auditory acuity. Nose:  No deformity, discharge, or lesions. Mouth:  No deformity or lesions,oropharynx pink & moist. Neck:  Supple; no masses or thyromegaly. Abdomen:  Normal bowel sounds.  No bruits.  Soft, non-tender and non-distended without masses,  hepatosplenomegaly or hernias noted.  No guarding or rebound tenderness.    Msk:  Symmetrical without gross deformities. Good, equal movement & strength bilaterally. Pulses:  Normal pulses noted. Extremities:  No clubbing or edema.  No cyanosis. Neurologic:  Alert and oriented x3;  grossly normal neurologically. Skin:  Intact without significant lesions or rashes. No jaundice. Lymph Nodes:  No significant cervical adenopathy. Psych:  Alert and cooperative. Normal mood and affect.   Labs: CBC    Component Value Date/Time   WBC 5.4 07/05/2019 1437   RBC 3.47 (L) 07/05/2019 1437   HGB 5.0 cL (LL) 07/05/2019 1437   HCT 18.5 cL (LL) 07/05/2019 1437   PLT 400.0 07/05/2019 1437   MCV 53.2 aL (L) 07/05/2019 1437   MCHC 27.3 aL (L) 07/05/2019 1437   RDW 21.1 (H) 07/05/2019 1437   CMP     Component Value Date/Time   NA 139 02/22/2019 0840   K 4.4 02/22/2019 0840   CL 106 02/22/2019 0840   CO2 27 02/22/2019 0840   GLUCOSE 83 02/22/2019 0840   BUN 10 02/22/2019 0840   CREATININE 0.54 02/22/2019 0840   CALCIUM 8.5 02/22/2019 0840   PROT 6.4 02/22/2019 0840   ALBUMIN 3.9 02/22/2019 0840   AST 9 02/22/2019 0840   ALT 4 02/22/2019 0840   ALKPHOS 72 02/22/2019 0840   BILITOT 0.4 02/22/2019 0840    Imaging Studies: No results found.  Assessment and Plan:   Madeline Cannon is a 43 y.o. y/o female has been referred for iron deficiency anemia with no active sources of bleeding, with previous history of gastric bypass  Her hemoglobin is abnormally low from just having iron deficiency from gastric bypass anatomy  I have communicated today with her primary care provider, and hematology to expedite her care  I reassured her that since she is not having any active bleeding, and her vitals are stable, as long as her hemoglobin is responding to transfusions, she may not need to be admitted as an inpatient as this was her only fear about getting a blood transfusion by her PCP.  She is now  willing to get the blood transfusion.  Since her last hemoglobin was 12 days ago, we have ordered a stat CBC and contacted [facility] and they will be able to accommodate her for a PRBC transfusion tomorrow, if hemoglobin still below 7.  We have set her up for a hematology appointment tomorrow as well  She is agreeable with the above plan  Once her hemoglobin improves, we can set her up for an EGD and colonoscopy for iron deficiency anemia  Above plan communicated with her primary care provider Dr. Selena Battenody who is agreeable with  the plan as well.  Dr Melodie Bouillon  Speech recognition software was used to dictate the above note.

## 2019-07-18 ENCOUNTER — Telehealth: Payer: Self-pay | Admitting: Gastroenterology

## 2019-07-18 ENCOUNTER — Ambulatory Visit
Admission: RE | Admit: 2019-07-18 | Discharge: 2019-07-18 | Disposition: A | Discharge status: Home / Self Care | Payer: PRIVATE HEALTH INSURANCE | Source: Ambulatory Visit | Attending: Gastroenterology | Admitting: Gastroenterology

## 2019-07-18 ENCOUNTER — Other Ambulatory Visit: Payer: Self-pay

## 2019-07-18 ENCOUNTER — Telehealth: Payer: Self-pay | Admitting: Licensed Clinical Social Worker

## 2019-07-18 ENCOUNTER — Inpatient Hospital Stay: Payer: PRIVATE HEALTH INSURANCE | Attending: Internal Medicine | Admitting: Internal Medicine

## 2019-07-18 ENCOUNTER — Encounter: Payer: Self-pay | Admitting: Internal Medicine

## 2019-07-18 ENCOUNTER — Ambulatory Visit: Payer: PRIVATE HEALTH INSURANCE

## 2019-07-18 DIAGNOSIS — D508 Other iron deficiency anemias: Secondary | ICD-10-CM

## 2019-07-18 DIAGNOSIS — D649 Anemia, unspecified: Secondary | ICD-10-CM | POA: Insufficient documentation

## 2019-07-18 DIAGNOSIS — E538 Deficiency of other specified B group vitamins: Secondary | ICD-10-CM

## 2019-07-18 DIAGNOSIS — Z79899 Other long term (current) drug therapy: Secondary | ICD-10-CM

## 2019-07-18 DIAGNOSIS — Z9884 Bariatric surgery status: Secondary | ICD-10-CM

## 2019-07-18 DIAGNOSIS — E559 Vitamin D deficiency, unspecified: Secondary | ICD-10-CM

## 2019-07-18 LAB — ABO/RH: ABO/RH(D): A POS

## 2019-07-18 LAB — CBC
HCT: 23.9 % — ABNORMAL LOW (ref 36.0–46.0)
Hematocrit: 25.3 % — ABNORMAL LOW (ref 34.0–46.6)
Hemoglobin: 5.9 g/dL — CL (ref 11.1–15.9)
Hemoglobin: 5.9 g/dL — ABNORMAL LOW (ref 12.0–15.0)
MCH: 15 pg — ABNORMAL LOW (ref 26.6–33.0)
MCH: 15.4 pg — ABNORMAL LOW (ref 26.0–34.0)
MCHC: 23.3 g/dL — CL (ref 31.5–35.7)
MCHC: 24.7 g/dL — ABNORMAL LOW (ref 30.0–36.0)
MCV: 64 fL — ABNORMAL LOW (ref 79–97)
MCV: 62.6 fL — ABNORMAL LOW (ref 80.0–100.0)
Platelets: 612 10*3/uL — ABNORMAL HIGH (ref 150–450)
Platelets: 495 10*3/uL — ABNORMAL HIGH (ref 150–400)
RBC: 3.94 x10E6/uL (ref 3.77–5.28)
RBC: 3.82 MIL/uL — ABNORMAL LOW (ref 3.87–5.11)
RDW: 25 % — ABNORMAL HIGH (ref 11.7–15.4)
RDW: 28.4 % — ABNORMAL HIGH (ref 11.5–15.5)
WBC: 5.9 10*3/uL (ref 3.4–10.8)
WBC: 5 10*3/uL (ref 4.0–10.5)
nRBC: 0 % (ref 0.0–0.2)

## 2019-07-18 LAB — PREPARE RBC (CROSSMATCH)

## 2019-07-18 MED ORDER — SODIUM CHLORIDE 0.9% IV SOLUTION: Freq: Once | INTRAVENOUS | Status: DC

## 2019-07-18 MED ORDER — SODIUM CHLORIDE FLUSH 0.9 % IV SOLN
INTRAVENOUS | Status: AC
  Filled 2019-07-18: qty 10

## 2019-07-18 NOTE — Telephone Encounter (Signed)
Cheryl with [employer] called with Critical lab results on patient & ask [place] to call her @ [phone] option 1 ref# [id number].

## 2019-07-18 NOTE — Progress Notes (Signed)
Pt receiving a blood transfusion at this time in [place]. Dr. B will change her apts to 2:15  Pm for a virtual visit today. Pt aware and agreeable.

## 2019-07-18 NOTE — Assessment & Plan Note (Addendum)
#  Severe anemia-hemoglobin 5.8 MCV 50s [October 2020].  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  #Patient symptomatic; getting to units of PRBC transfusion today. Recommend proceeding with IV Feraheme. Patient will keep up appointment as planned for Feraheme next week in [place].  # B12 deficiency-secondary gastric bypass July 2020 [56]- recommend B12 injections.   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   # Thank you Dr.Tahiliani for allowing me to participate in the care of your pleasant patient. Please do not hesitate to contact me with questions or concerns in the interim. Discussed with Dr.Cody.   # DISPOSITION:  # IV Ferrahem weekly x 3. [start week of dec 7th ] # B12 injections weekly x3 # 6 weeks- MD; labs-cbc/MD; Orlan Leavens injec- Dr.B

## 2019-07-18 NOTE — Telephone Encounter (Signed)
Patient Hemoglobin is 5.9 and was calling to report this information

## 2019-07-18 NOTE — Telephone Encounter (Signed)
Cindy from same day surgery called stating that the patient is receiving blood today and they would not be complete in time for the patient to make her new hematology appt with Dr. Jacinto Reap. Advised that scheduling would give the patient a call next week to reschedule.

## 2019-07-18 NOTE — Progress Notes (Signed)
I connected with Eduard Clos on 07/18/19 at 11:15 AM EST by video enabled telemedicine visit and verified that I am speaking with the correct person using two identifiers.  I discussed the limitations, risks, security and privacy concerns of performing an evaluation and management service by telemedicine and the availability of in-person appointments. I also discussed with the patient that there may be a patient responsible charge related to this service. The patient expressed understanding and agreed to proceed.    Other persons participating in the visit and their role in the encounter: RN Patient's location: Hospital/getting blood transfusion Provider's location: Office  Oncology History   No history exists.   #Severe IRON DEF anemia-hemoglobin 5.8 MCV 50s [October 2020].  [malabsorption from gastric bypass].  #B12 deficiency-secondary gastric bypass July 2020 [56]  #Vitamin D deficiency-  # gastric bypass [2014; [place], [place]]  Chief Complaint: Severe anemia   History of present illness:Madeline Cannon 43 y.o.  female with history of gastric bypass-has been referred to [place] for further evaluation recommendations for severe anemia.  Patient stated she has been fatigued for the last many months. Also had severe pica for ice.   Patient was evaluated by GI-noted to have severe anemia hemoglobin 5.8; also severe microcytosis. Ferritin was 1.  Patient received iron infusion a week ago in [place]. Patient is currently getting fluids of PRBC transfusion in same[facility]day surgery/[facility].  Blood in stools: None Change in bowel habits- None Blood in urine: None Vaginal bleeding: None Difficulty swallowing: None Abnormal weight loss: None; lost about 170 pounds since gastric bypass.  Iron supplementation: P.o. iron Stomach surgery: yes.  EGD/ colonoscopy-none ;capsule-none;  Bone marrow Biopsy-none    Observation/objective: As above  Assessment and plan: Iron deficiency  anemia secondary to inadequate dietary iron intake #Severe anemia-hemoglobin 5.8 MCV 50s [October 2020].  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  #Patient symptomatic; getting to units of PRBC transfusion today. Recommend proceeding with IV Feraheme. Patient will keep up appointment as planned for Feraheme next week in [place].  # B12 deficiency-secondary gastric bypass July 2020 [56]- recommend B12 injections.   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   # Thank you Dr.Tahiliani for allowing me to participate in the care of your pleasant patient. Please do not hesitate to contact me with questions or concerns in the interim. Discussed with Dr.Cody.   # DISPOSITION:  # IV Ferrahem weekly x 3. [start week of dec 7th ] # B12 injections weekly x3 # 6 weeks- MD; labs-cbc/MD; Orlan Leavens injec- Dr.B   Follow-up instructions:  I discussed the assessment and treatment plan with the patient.  The patient was provided an opportunity to ask questions and all were answered.  The patient agreed with the plan and demonstrated understanding of instructions.  The patient was advised to call back or seek an in person evaluation if the symptoms worsen or if the condition fails to improve as anticipated.   Dr. Charlaine Dalton [place] at [facility] 07/18/2019 3:37 PM

## 2019-07-19 LAB — TYPE AND SCREEN
ABO/RH(D): A POS
Antibody Screen: NEGATIVE
Unit division: 0
Unit division: 0

## 2019-07-19 LAB — BPAM RBC
Blood Product Expiration Date: 202011282359
Blood Product Expiration Date: 202012232359
ISSUE DATE / TIME: 202011250925
ISSUE DATE / TIME: 202011251207
Unit Type and Rh: 6200
Unit Type and Rh: 6200

## 2019-07-23 ENCOUNTER — Other Ambulatory Visit: Payer: Self-pay | Admitting: *Deleted

## 2019-07-23 ENCOUNTER — Telehealth: Payer: Self-pay | Admitting: *Deleted

## 2019-07-23 ENCOUNTER — Encounter (HOSPITAL_COMMUNITY)
Admission: RE | Admit: 2019-07-23 | Discharge: 2019-07-23 | Disposition: A | Discharge status: Home / Self Care | Payer: PRIVATE HEALTH INSURANCE | Source: Ambulatory Visit | Attending: Family Medicine | Admitting: Family Medicine

## 2019-07-23 ENCOUNTER — Other Ambulatory Visit: Payer: Self-pay

## 2019-07-23 DIAGNOSIS — D508 Other iron deficiency anemias: Secondary | ICD-10-CM | POA: Insufficient documentation

## 2019-07-23 MED ORDER — SODIUM CHLORIDE 0.9 % IV SOLN
510.0000 mg | INTRAVENOUS | Status: DC
  Administered 2019-07-23: 510 mg via INTRAVENOUS
  Filled 2019-07-23: qty 510

## 2019-07-23 NOTE — Telephone Encounter (Signed)
Pt called with apts - she does not want to come to [facility] but would rather be established by [facility] as this is closer to her work place in [place] and she lives in [place], [place]  Referral fwd to - [place] per patient's request.

## 2019-07-23 NOTE — Telephone Encounter (Signed)
-----   Message from Wallene Dales sent at 07/23/2019  3:07 PM EST ----- Regarding: Move Appts To [facility] VM left by patient indicates that she would like to have all her infusions scheduled in [place] if possible. She stated it would be easier for her to have them completed up there. I see where the patient has had 2 doses completed in [place] somewhere, but I'm not quite sure of the location. She is requesting a call back to see if this could be arranged on her behalf.  Thank you Jerene Pitch

## 2019-07-23 NOTE — Telephone Encounter (Signed)
msg recvd from Dr. Rogue Bussing to set up for the following.  she will get first ferrahe in [place] today with her pcp ; and then as follow: [C- plese schedule] # DISPOSITION: # IV Ferrahem weekly x 3. [start week of dec 7th ] # B12 injections weekly x3 # 6 weeks- MD; labs-cbc/MD; Orlan Leavens injec- Dr.B

## 2019-07-24 ENCOUNTER — Encounter: Payer: Self-pay | Admitting: Family Medicine

## 2019-07-24 ENCOUNTER — Telehealth: Payer: Self-pay

## 2019-07-24 ENCOUNTER — Other Ambulatory Visit: Payer: Self-pay

## 2019-07-24 ENCOUNTER — Ambulatory Visit: Payer: PRIVATE HEALTH INSURANCE

## 2019-07-24 ENCOUNTER — Ambulatory Visit (INDEPENDENT_AMBULATORY_CARE_PROVIDER_SITE_OTHER): Payer: PRIVATE HEALTH INSURANCE

## 2019-07-24 DIAGNOSIS — E538 Deficiency of other specified B group vitamins: Secondary | ICD-10-CM

## 2019-07-24 MED ORDER — CYANOCOBALAMIN 1000 MCG/ML IJ SOLN
1000.0000 ug | Freq: Once | INTRAMUSCULAR | Status: AC
  Administered 2019-07-24: 1000 ug via INTRAMUSCULAR

## 2019-07-24 NOTE — Telephone Encounter (Signed)
Patient states that after getting iron infusion- she has done 2 so far she gets joint/muscle pain/aches the next day for about a day and then it goes away. No there symptoms. Is this normal?  Also patient was asking when to re check her levels of iron and I looked as of 07/11/2019 note needs recheck 4 weeks after starting infusion. I will speak with patient about this once first part of message is answered by Dr Einar Pheasant.

## 2019-07-24 NOTE — Progress Notes (Signed)
Per orders of Dr. Einar Pheasant, injection of B12 #1 of 4 weekly given by Kris Mouton. Patient tolerated injection well.

## 2019-07-26 ENCOUNTER — Encounter: Payer: Self-pay | Admitting: Family Medicine

## 2019-07-26 DIAGNOSIS — D508 Other iron deficiency anemias: Secondary | ICD-10-CM

## 2019-07-26 DIAGNOSIS — E611 Iron deficiency: Secondary | ICD-10-CM

## 2019-07-26 DIAGNOSIS — E538 Deficiency of other specified B group vitamins: Secondary | ICD-10-CM

## 2019-07-26 NOTE — Telephone Encounter (Signed)
Referral to Hematology in [facility] to take over management due to location. See separate encounter

## 2019-07-26 NOTE — Telephone Encounter (Signed)
Spoke to patient.   She would like to change hematology follow-up to [facility] as this is more convenient for her.   Also discussed side effect from iron infusion - suspect this is normal. Monitor for continued symptoms.   Will message back to [facility] Hematology office to clarify follow-up plan and transfer to [facility] location.

## 2019-07-27 ENCOUNTER — Telehealth: Payer: Self-pay | Admitting: Family

## 2019-07-27 NOTE — Telephone Encounter (Signed)
Called and spoke with patient regarding appointments .  Letter/Calendar mailed

## 2019-07-30 ENCOUNTER — Ambulatory Visit: Payer: PRIVATE HEALTH INSURANCE

## 2019-07-30 NOTE — Telephone Encounter (Addendum)
Dr. Einar Pheasant I called to get patient scheduled for this week for iron infusion and they do not have anything available this week and said they spoke with patient right before I called them and told patient this information. Not sure how you would like to proceed?

## 2019-08-01 ENCOUNTER — Ambulatory Visit: Payer: PRIVATE HEALTH INSURANCE

## 2019-08-02 ENCOUNTER — Other Ambulatory Visit: Payer: Self-pay

## 2019-08-02 ENCOUNTER — Ambulatory Visit: Payer: PRIVATE HEALTH INSURANCE

## 2019-08-02 ENCOUNTER — Ambulatory Visit (INDEPENDENT_AMBULATORY_CARE_PROVIDER_SITE_OTHER): Payer: PRIVATE HEALTH INSURANCE | Admitting: Gastroenterology

## 2019-08-02 ENCOUNTER — Encounter: Payer: Self-pay | Admitting: Gastroenterology

## 2019-08-02 VITALS — BP 129/80 | HR 72 | Temp 97.5°F | Wt 173.1 lb

## 2019-08-02 DIAGNOSIS — D509 Iron deficiency anemia, unspecified: Secondary | ICD-10-CM

## 2019-08-02 MED ORDER — NA SULFATE-K SULFATE-MG SULF 17.5-3.13-1.6 GM/177ML PO SOLN: 354.0000 mL | Freq: Once | ORAL | 0 refills | Status: AC

## 2019-08-02 MED ORDER — BISACODYL EC 5 MG PO TBEC: DELAYED_RELEASE_TABLET | ORAL | 0 refills | Status: DC

## 2019-08-03 ENCOUNTER — Other Ambulatory Visit: Payer: Self-pay | Admitting: Family

## 2019-08-03 ENCOUNTER — Ambulatory Visit: Payer: PRIVATE HEALTH INSURANCE

## 2019-08-03 DIAGNOSIS — D649 Anemia, unspecified: Secondary | ICD-10-CM

## 2019-08-03 DIAGNOSIS — D508 Other iron deficiency anemias: Secondary | ICD-10-CM

## 2019-08-03 LAB — HEMOGLOBIN: Hemoglobin: 8.6 g/dL — ABNORMAL LOW (ref 11.1–15.9)

## 2019-08-03 NOTE — Progress Notes (Signed)
Madeline Antigua, MD [address]  [place]  [place], [place] [address]  Main: [phone]  Fax: [phone]   Primary Care Physician: Lesleigh Noe, MD   Chief Complaint  Patient presents with  . Anemia    states she is feeling better    HPI: Madeline Cannon is a 43 y.o. female here for follow-up of iron deficiency anemia.  Since her last blood transfusion and IV iron transfusions patient reports improvement in energy level.  Also has noticed that she is not eating ice anymore and used to have these cravings all the time before.  No episodes of bleeding.  Is following with hematology closely and receiving IV iron transfusions as needed.  History reviewed. No pertinent past medical history.  Social history: No smoking alcohol or drugs  Current Outpatient Medications  Medication Sig Dispense Refill  . B Complex-C (B-COMPLEX WITH VITAMIN C) tablet Take 1 tablet by mouth daily. 90 tablet 3  . Multiple Vitamin (MULTIVITAMIN) tablet Take 1 tablet by mouth daily.    . Nutritional Supplements (VITAMIN D BOOSTER PO) Take by mouth.    . sertraline (ZOLOFT) 100 MG tablet Take 1 tablet (100 mg total) by mouth daily. 90 tablet 3  . sertraline (ZOLOFT) 25 MG tablet Take 1 tablet (25 mg total) by mouth daily. 90 tablet 3  . vitamin B-12 (CYANOCOBALAMIN) 500 MCG tablet Take 1 tablet (500 mcg total) by mouth daily. 90 tablet 3  . bisacodyl (BISACODYL) 5 MG EC tablet Take 2 tablets (10mg ) by mouth the day before your procedure between 1pm-3pm. 2 tablet 0   Current Facility-Administered Medications  Medication Dose Route Frequency Provider Last Rate Last Admin  . 0.9 %  sodium chloride infusion (Manually program via Guardrails IV Fluids)   Intravenous Once Virgel Manifold, MD        Allergies as of 08/02/2019  . (No Known Allergies)    ROS:  General: Negative for anorexia, weight loss, fever, chills, fatigue, weakness. ENT: Negative for hoarseness, difficulty swallowing ,  nasal congestion. CV: Negative for chest pain, angina, palpitations, dyspnea on exertion, peripheral edema.  Respiratory: Negative for dyspnea at rest, dyspnea on exertion, cough, sputum, wheezing.  GI: See history of present illness. GU:  Negative for dysuria, hematuria, urinary incontinence, urinary frequency, nocturnal urination.  Endo: Negative for unusual weight change.    Physical Examination:   BP 129/80 (BP Location: Left Arm, Patient Position: Sitting, Cuff Size: Normal)   Pulse 72   Temp (!) 97.5 F (36.4 C) (Oral)   Wt 173 lb 2 oz (78.5 kg)   BMI 24.84 kg/m   General: Well-nourished, well-developed in no acute distress.  Eyes: No icterus. Conjunctivae pink. Mouth: Oropharyngeal mucosa moist and pink , no lesions erythema or exudate. Neck: Supple, Trachea midline Abdomen: Bowel sounds are normal, nontender, nondistended, no hepatosplenomegaly or masses, no abdominal bruits or hernia , no rebound or guarding.   Extremities: No lower extremity edema. No clubbing or deformities. Neuro: Alert and oriented x 3.  Grossly intact. Skin: Warm and dry, no jaundice.   Psych: Alert and cooperative, normal mood and affect.   Labs: CMP     Component Value Date/Time   NA 139 02/22/2019 0840   K 4.4 02/22/2019 0840   CL 106 02/22/2019 0840   CO2 27 02/22/2019 0840   GLUCOSE 83 02/22/2019 0840   BUN 10 02/22/2019 0840   CREATININE 0.54 02/22/2019 0840   CALCIUM 8.5 02/22/2019 0840   PROT  6.4 02/22/2019 0840   ALBUMIN 3.9 02/22/2019 0840   AST 9 02/22/2019 0840   ALT 4 02/22/2019 0840   ALKPHOS 72 02/22/2019 0840   BILITOT 0.4 02/22/2019 0840   Lab Results  Component Value Date   WBC 5.0 07/18/2019   HGB 8.6 (L) 08/02/2019   HCT 23.9 (L) 07/18/2019   MCV 62.6 (L) 07/18/2019   PLT 495 (H) 07/18/2019    Imaging Studies: No results found.  Assessment and Plan:   Madeline Cannon is a 43 y.o. y/o female here for follow-up of iron deficiency anemia  Hemoglobin rechecked  today and shows improvement of hemoglobin to 8.6 Schedule for EGD and colonoscopy for iron deficiency anemia  No episodes of active bleeding, no indication for urgent endoscopy at this time.  We will schedule within the next couple weeks.  Alarm symptoms discussed for patient to inform us with if these occur.  I have discussed alternative options, risks & benefits,  which include, but are not limited to, bleeding, infection, perforation,respiratory complication & drug reaction.  The patient agrees with this plan & written consent will be obtained.       Dr Melodie Bouillon

## 2019-08-06 ENCOUNTER — Inpatient Hospital Stay: Payer: PRIVATE HEALTH INSURANCE

## 2019-08-06 ENCOUNTER — Other Ambulatory Visit: Payer: Self-pay

## 2019-08-06 ENCOUNTER — Inpatient Hospital Stay (HOSPITAL_BASED_OUTPATIENT_CLINIC_OR_DEPARTMENT_OTHER): Payer: PRIVATE HEALTH INSURANCE | Admitting: Family

## 2019-08-06 ENCOUNTER — Ambulatory Visit: Payer: PRIVATE HEALTH INSURANCE

## 2019-08-06 ENCOUNTER — Inpatient Hospital Stay: Payer: PRIVATE HEALTH INSURANCE | Attending: Family

## 2019-08-06 ENCOUNTER — Encounter: Payer: Self-pay | Admitting: Family

## 2019-08-06 VITALS — BP 111/70 | HR 70 | Temp 97.8°F | Resp 18 | Ht 70.0 in | Wt 173.8 lb

## 2019-08-06 DIAGNOSIS — Z8249 Family history of ischemic heart disease and other diseases of the circulatory system: Secondary | ICD-10-CM

## 2019-08-06 DIAGNOSIS — Z808 Family history of malignant neoplasm of other organs or systems: Secondary | ICD-10-CM

## 2019-08-06 DIAGNOSIS — Z833 Family history of diabetes mellitus: Secondary | ICD-10-CM | POA: Diagnosis not present

## 2019-08-06 DIAGNOSIS — F17210 Nicotine dependence, cigarettes, uncomplicated: Secondary | ICD-10-CM

## 2019-08-06 DIAGNOSIS — E538 Deficiency of other specified B group vitamins: Secondary | ICD-10-CM

## 2019-08-06 DIAGNOSIS — Z836 Family history of other diseases of the respiratory system: Secondary | ICD-10-CM

## 2019-08-06 DIAGNOSIS — R5383 Other fatigue: Secondary | ICD-10-CM | POA: Insufficient documentation

## 2019-08-06 DIAGNOSIS — R002 Palpitations: Secondary | ICD-10-CM | POA: Diagnosis not present

## 2019-08-06 DIAGNOSIS — D649 Anemia, unspecified: Secondary | ICD-10-CM

## 2019-08-06 DIAGNOSIS — Z79899 Other long term (current) drug therapy: Secondary | ICD-10-CM | POA: Diagnosis not present

## 2019-08-06 DIAGNOSIS — Z9884 Bariatric surgery status: Secondary | ICD-10-CM

## 2019-08-06 DIAGNOSIS — Z818 Family history of other mental and behavioral disorders: Secondary | ICD-10-CM | POA: Insufficient documentation

## 2019-08-06 DIAGNOSIS — Z83438 Family history of other disorder of lipoprotein metabolism and other lipidemia: Secondary | ICD-10-CM | POA: Diagnosis not present

## 2019-08-06 DIAGNOSIS — Z8041 Family history of malignant neoplasm of ovary: Secondary | ICD-10-CM | POA: Insufficient documentation

## 2019-08-06 DIAGNOSIS — Z8261 Family history of arthritis: Secondary | ICD-10-CM

## 2019-08-06 DIAGNOSIS — R42 Dizziness and giddiness: Secondary | ICD-10-CM

## 2019-08-06 DIAGNOSIS — Z800 Family history of malignant neoplasm of digestive organs: Secondary | ICD-10-CM | POA: Diagnosis not present

## 2019-08-06 DIAGNOSIS — D508 Other iron deficiency anemias: Secondary | ICD-10-CM

## 2019-08-06 DIAGNOSIS — K912 Postsurgical malabsorption, not elsewhere classified: Secondary | ICD-10-CM | POA: Diagnosis not present

## 2019-08-06 LAB — RETICULOCYTES
Immature Retic Fract: 8.3 % (ref 2.3–15.9)
RBC.: 4.79 MIL/uL (ref 3.87–5.11)
Retic Count, Absolute: 11.5 10*3/uL — ABNORMAL LOW (ref 19.0–186.0)
Retic Ct Pct: <0.4 % — ABNORMAL LOW (ref 0.4–3.1)

## 2019-08-06 LAB — CMP (CANCER CENTER ONLY)
ALT: 13 U/L (ref 0–44)
AST: 16 U/L (ref 15–41)
Albumin: 4.3 g/dL (ref 3.5–5.0)
Alkaline Phosphatase: 72 U/L (ref 38–126)
Anion gap: 9 (ref 5–15)
BUN: 13 mg/dL (ref 6–20)
CO2: 27 mmol/L (ref 22–32)
Calcium: 9.1 mg/dL (ref 8.9–10.3)
Chloride: 104 mmol/L (ref 98–111)
Creatinine: 0.75 mg/dL (ref 0.44–1.00)
GFR, Est AFR Am: >60 mL/min (ref >60)
GFR, Estimated: >60 mL/min (ref >60)
Glucose, Bld: 109 mg/dL — ABNORMAL HIGH (ref 70–99)
Potassium: 3.6 mmol/L (ref 3.5–5.1)
Sodium: 140 mmol/L (ref 135–145)
Total Bilirubin: 0.3 mg/dL (ref 0.3–1.2)
Total Protein: 6.7 g/dL (ref 6.5–8.1)

## 2019-08-06 LAB — CBC WITH DIFFERENTIAL (CANCER CENTER ONLY)
Abs Immature Granulocytes: 0.03 10*3/uL (ref 0.00–0.07)
Basophils Absolute: 0.1 10*3/uL (ref 0.0–0.1)
Basophils Relative: 1 %
Eosinophils Absolute: 0.2 10*3/uL (ref 0.0–0.5)
Eosinophils Relative: 4 %
HCT: 36.6 % (ref 36.0–46.0)
Hemoglobin: 10.2 g/dL — ABNORMAL LOW (ref 12.0–15.0)
Immature Granulocytes: 1 %
Lymphocytes Relative: 30 %
Lymphs Abs: 1.8 10*3/uL (ref 0.7–4.0)
MCH: 21.3 pg — ABNORMAL LOW (ref 26.0–34.0)
MCHC: 27.9 g/dL — ABNORMAL LOW (ref 30.0–36.0)
MCV: 76.6 fL — ABNORMAL LOW (ref 80.0–100.0)
Monocytes Absolute: 0.4 10*3/uL (ref 0.1–1.0)
Monocytes Relative: 7 %
Neutro Abs: 3.4 10*3/uL (ref 1.7–7.7)
Neutrophils Relative %: 57 %
Platelet Count: 242 10*3/uL (ref 150–400)
RBC: 4.78 MIL/uL (ref 3.87–5.11)
WBC Count: 6 10*3/uL (ref 4.0–10.5)
nRBC: 0 % (ref 0.0–0.2)

## 2019-08-06 LAB — SAVE SMEAR(SSMR), FOR PROVIDER SLIDE REVIEW

## 2019-08-06 MED ORDER — CYANOCOBALAMIN 1000 MCG/ML IJ SOLN
INTRAMUSCULAR | Status: AC
  Filled 2019-08-06: qty 1

## 2019-08-06 MED ORDER — CYANOCOBALAMIN 1000 MCG/ML IJ SOLN
1000.0000 ug | Freq: Once | INTRAMUSCULAR | Status: AC
  Administered 2019-08-06: 1000 ug via INTRAMUSCULAR

## 2019-08-06 NOTE — Patient Instructions (Signed)

## 2019-08-06 NOTE — Progress Notes (Signed)
Hematology/Oncology Consultation   Name: Madeline Cannon      MRN: [id number]    Location: Room/bed info not found  Date: 08/06/2019 Time:3:29 PM   REFERRING PHYSICIAN: Varnita B. Bonna Gains, MD  REASON FOR CONSULT: Iron deficiency anemia   DIAGNOSIS:  Iron deficiency anemia  B 12 deficiency   HISTORY OF PRESENT ILLNESS: Ms. 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  She received 2 units of blood in November for Hgb of 5.0 followed by 2 doses of IV iron.  Hgb today is up to 10.2, MCV 76 and platelet count 242.  She still has intermittent fatigued, dizziness, palpitations and brain fog.  The ice cravings and cheilitis at the corners of her mouth have resolved. Her cycle is regular with normal flow. No clots.  She has not noted any other blood loss. No bruising or petechiae.  She has seen GI and is schedule for an endoscopy and colonoscopy with Dr. Bonna Gains on 08/14/2019.  There is no family history of anemia that she is aware of.  No personal cancer history.  Her maternal grandmother had ovarian cancer and her maternal uncle had two bouts with lymphoma.  She has no children and history of 1 miscarriage in 2014.  No fever, chills, n/v, cough, rash, SOB, chest pain, abdominal pain or changes in bowel or bladder habits.  No swelling, tenderness, numbness or tingling in her extremities.  She does not drink alcohol or use recreational drugs.  She does smoke 5-8 cigarettes a day but is trying to quit.  She has not been able to work out due to the side effects of the iron deficiency.  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   ROS: All other 10 point review of systems is negative.   PAST MEDICAL HISTORY:   No past medical history on file.  ALLERGIES: No Known Allergies     MEDICATIONS:  Current Outpatient Medications on File Prior to Visit  Medication Sig Dispense Refill  . B Complex-C (B-COMPLEX WITH VITAMIN C) tablet Take 1 tablet by mouth daily. 90 tablet 3  . bisacodyl (BISACODYL) 5 MG EC tablet Take 2 tablets (10mg ) by mouth the day before your procedure between 1pm-3pm. 2 tablet 0  . Multiple Vitamin (MULTIVITAMIN) tablet Take 1 tablet by mouth daily.    . Nutritional Supplements (VITAMIN D BOOSTER PO) Take by mouth.    . sertraline (ZOLOFT) 100 MG tablet Take 1 tablet (100 mg total) by mouth daily. 90 tablet 3  . sertraline (ZOLOFT) 25 MG tablet Take 1 tablet (25 mg total) by mouth daily. 90 tablet 3  . vitamin B-12 (CYANOCOBALAMIN) 500 MCG tablet Take 1 tablet (500 mcg total) by mouth daily. 90 tablet 3   No current facility-administered medications on file prior to visit.     PAST SURGICAL HISTORY Past Surgical History:  Procedure Laterality Date  . CHOLECYSTECTOMY  2000  . GASTRIC BYPASS  2014    FAMILY HISTORY: Family History  Problem Relation Age of Onset  . Diabetes Father   . Hypertension Father   . Hyperlipidemia Father   . Arthritis Maternal Grandmother   . Stomach cancer Maternal Grandmother   . Asthma Maternal Grandfather   . Diabetes Maternal Grandfather   . Heart attack Maternal Grandfather 80  .  Heart disease Maternal Grandfather   . Hypertension Maternal Grandfather   . Depression Paternal Grandmother   . Hypertension Paternal Grandmother   . Hyperlipidemia Paternal Grandmother   . Diabetes Paternal Grandmother   . Depression Paternal Grandfather     SOCIAL HISTORY:  reports that she has been smoking cigarettes. She started smoking about 22 years ago. She has a 10.00 pack-year smoking history. She has never used smokeless tobacco. She reports that she does not drink alcohol or use drugs.  PERFORMANCE STATUS: The patient's performance status is 1 - Symptomatic but completely ambulatory  PHYSICAL EXAM: Most Recent  Vital Signs: Blood pressure 111/70, pulse 70, temperature 97.8 F (36.6 C), temperature source Temporal, resp. rate 18, height 5\' 10"  (1.778 m), weight 173 lb 12.8 oz (78.8 kg), SpO2 100 %. BP 111/70 (BP Location: Left Arm, Patient Position: Sitting)   Pulse 70   Temp 97.8 F (36.6 C) (Temporal)   Resp 18   Ht 5\' 10"  (1.778 m)   Wt 173 lb 12.8 oz (78.8 kg)   SpO2 100%   BMI 24.94 kg/m   General Appearance:    Alert, cooperative, no distress, appears stated age  Head:    Normocephalic, without obvious abnormality, atraumatic  Eyes:    PERRL, conjunctiva/corneas clear, EOM's intact, fundi    benign, both eyes             Throat:   Lips, mucosa, and tongue normal; teeth and gums normal  Neck:   Supple, symmetrical, trachea midline, no adenopathy;       thyroid:  No enlargement/tenderness/nodules; no carotid   bruit or JVD  Back:     Symmetric, no curvature, ROM normal, no CVA tenderness  Lungs:     Clear to auscultation bilaterally, respirations unlabored  Chest wall:    No tenderness or deformity  Heart:    Regular rate and rhythm, S1 and S2 normal, no murmur, rub   or gallop  Abdomen:     Soft, non-tender, bowel sounds active all four quadrants,    no masses, no organomegaly        Extremities:   Extremities normal, atraumatic, no cyanosis or edema  Pulses:   2+ and symmetric all extremities  Skin:   Skin color, texture, turgor normal, no rashes or lesions  Lymph nodes:   Cervical, supraclavicular, and axillary nodes normal  Neurologic:   CNII-XII intact. Normal strength, sensation and reflexes      throughout    LABORATORY DATA:  Results for orders placed or performed in visit on 08/06/19 (from the past 48 hour(s))  CBC with Differential (Cancer Center Only)     Status: Abnormal   Collection Time: 08/06/19  2:52 PM  Result Value Ref Range   WBC Count 6.0 4.0 - 10.5 K/uL   RBC 4.78 3.87 - 5.11 MIL/uL   Hemoglobin 10.2 (L) 12.0 - 15.0 g/dL    Comment: Reticulocyte  Hemoglobin testing may be clinically indicated, consider ordering this additional test [id number]    HCT 36.6 36.0 - 46.0 %   MCV 76.6 (L) 80.0 - 100.0 fL   MCH 21.3 (L) 26.0 - 34.0 pg   MCHC 27.9 (L) 30.0 - 36.0 g/dL   RDW Not Measured 54.011.5 - 15.5 %    Comment: dimorphic population   Platelet Count 242 150 - 400 K/uL   nRBC 0.0 0.0 - 0.2 %   Neutrophils Relative % 57 %   Neutro Abs 3.4 1.7 - 7.7 K/uL  Lymphocytes Relative 30 %   Lymphs Abs 1.8 0.7 - 4.0 K/uL   Monocytes Relative 7 %   Monocytes Absolute 0.4 0.1 - 1.0 K/uL   Eosinophils Relative 4 %   Eosinophils Absolute 0.2 0.0 - 0.5 K/uL   Basophils Relative 1 %   Basophils Absolute 0.1 0.0 - 0.1 K/uL   Immature Granulocytes 1 %   Abs Immature Granulocytes 0.03 0.00 - 0.07 K/uL    Comment: Performed at [facility] at [facility], [address], [place], [place] [address]  [facility])     Status: None   Collection Time: 08/06/19  2:52 PM  Result Value Ref Range   Smear Review SMEAR STAINED AND AVAILABLE FOR REVIEW     Comment: Performed at [facility] at [facility], [address], [place], [place] [address]  Reticulocytes     Status: Abnormal   Collection Time: 08/06/19  2:53 PM  Result Value Ref Range   Retic Ct Pct <0.4 (L) 0.4 - 3.1 %    Comment: CORRECTED ON 12/14 AT 1514: PREVIOUSLY REPORTED AS 0.2   RBC. 4.79 3.87 - 5.11 MIL/uL   Retic Count, Absolute 11.5 (L) 19.0 - 186.0 K/uL   Immature Retic Fract 8.3 2.3 - 15.9 %    Comment: Performed at [facility] at [facility], [address], [place], [place] [address]      RADIOGRAPHY: No results found.     PATHOLOGY: None   ASSESSMENT/PLAN: Ms. 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  She is feeling much better since receiving blood and iron in November but still mildly symptomatic as  mentioned above.  She had missed her B 12 injection last week with her PCP so we did give today.  We will see what her iron studies show and bring her back in for infusion if needed.  We will go ahead and get her onto a Bariatric vitamin as well.  She is scheduled for endoscopy and colonoscopy on 08/14/2019.  We will plan to see her back in another 8 weeks for follow-up.   All questions were answered and she is in agreement with the plan. She will contact our office with any questions or concerns. We can certainly see her sooner if needed.   The patient was discussed with and also seen by Dr. Myna Hidalgo and he is in agreement with the aforementioned.   Emeline Gins, NP    Addendum: I saw and examined Ms. Rosal with Maralyn Sago.  I agree with the above assessment.  We will be interesting to see what the upper endoscopy and colonoscopy will show.  She did get iron before.  This seems to be helping her out which is nice to see.  I must say that for someone who has had gastric bypass, she really looks fantastic.  She has certainly lost weight.  I will get her blood smear under the microscope.  I do not see anything that looked all that suspicious.  She has some hypochromic red blood cells.  There were no nucleated red blood cells.  I saw no schistocytes.  White cells showed no hypersegmented polys.  She had no immature myeloid cells.  Platelets were adequate.  1 issue might be that she has a very low erythropoietin level.  Erythropoietin level is only 10.1.  As  such, this might be a situation where we have to consider using ESA if we cannot get her blood count up with iron.  She also has some B12 deficiency.  We will get her on B12 replacement.  I noted that her reticulocyte count is incredibly low.  As such, her iron still might be on the low side.  We will see what all of her hematologic studies look like.  Then we will plan to get her back.  We may have to give her some IV iron depending on  what the iron levels are.  We spent about 45 minutes with her.  She is incredibly interesting to talk to.  She is from [place] so she and Maralyn Sago had a good time talking about that.  Christin Bach, MD

## 2019-08-07 ENCOUNTER — Telehealth: Payer: Self-pay | Admitting: Family

## 2019-08-07 ENCOUNTER — Other Ambulatory Visit: Payer: Self-pay | Admitting: Family

## 2019-08-07 LAB — IRON AND TIBC
Iron: 43 ug/dL (ref 41–142)
Saturation Ratios: 14 % — ABNORMAL LOW (ref 21–57)
TIBC: 316 ug/dL (ref 236–444)
UIBC: 273 ug/dL (ref 120–384)

## 2019-08-07 LAB — FERRITIN: Ferritin: 99 ng/mL (ref 11–307)

## 2019-08-07 LAB — LACTATE DEHYDROGENASE: LDH: 200 U/L — ABNORMAL HIGH (ref 98–192)

## 2019-08-07 LAB — ERYTHROPOIETIN: Erythropoietin: 10.1 m[IU]/mL (ref 2.6–18.5)

## 2019-08-07 MED ORDER — BARIATRIC MULTIVITAMINS/IRON PO CAPS: 1.0000 | ORAL_CAPSULE | Freq: Every day | ORAL | 6 refills | Status: DC

## 2019-08-07 NOTE — Telephone Encounter (Signed)
No LOS 12/14 

## 2019-08-09 ENCOUNTER — Other Ambulatory Visit: Payer: Self-pay | Admitting: Family

## 2019-08-09 ENCOUNTER — Ambulatory Visit: Payer: PRIVATE HEALTH INSURANCE

## 2019-08-09 ENCOUNTER — Encounter: Payer: Self-pay | Admitting: Family

## 2019-08-09 ENCOUNTER — Telehealth: Payer: Self-pay

## 2019-08-09 DIAGNOSIS — E538 Deficiency of other specified B group vitamins: Secondary | ICD-10-CM

## 2019-08-09 DIAGNOSIS — D508 Other iron deficiency anemias: Secondary | ICD-10-CM

## 2019-08-09 DIAGNOSIS — Z9884 Bariatric surgery status: Secondary | ICD-10-CM

## 2019-08-09 MED ORDER — BARIATRIC MULTIVITAMINS/IRON PO CAPS: 1.0000 | ORAL_CAPSULE | Freq: Every day | ORAL | 6 refills | Status: AC

## 2019-08-09 NOTE — Telephone Encounter (Signed)
Patient states [facility] just called her and because she has a [employer] she has to pay 9000 dollars out of pocket. She states she will have a [employer] next year. Moved patient to January 14th. Called Endo and they are going to move patient to the 14th. Fixed referral

## 2019-08-10 ENCOUNTER — Other Ambulatory Visit: Payer: PRIVATE HEALTH INSURANCE

## 2019-08-13 ENCOUNTER — Ambulatory Visit: Payer: PRIVATE HEALTH INSURANCE

## 2019-08-14 ENCOUNTER — Ambulatory Visit: Payer: PRIVATE HEALTH INSURANCE

## 2019-08-15 ENCOUNTER — Ambulatory Visit: Payer: PRIVATE HEALTH INSURANCE | Admitting: Gastroenterology

## 2019-08-21 ENCOUNTER — Inpatient Hospital Stay: Payer: PRIVATE HEALTH INSURANCE

## 2019-08-21 ENCOUNTER — Ambulatory Visit: Payer: PRIVATE HEALTH INSURANCE

## 2019-08-21 ENCOUNTER — Other Ambulatory Visit: Payer: Self-pay

## 2019-08-21 VITALS — BP 120/69 | HR 65 | Temp 97.7°F | Resp 17

## 2019-08-21 DIAGNOSIS — D508 Other iron deficiency anemias: Secondary | ICD-10-CM | POA: Diagnosis not present

## 2019-08-21 MED ORDER — SODIUM CHLORIDE 0.9 % IV SOLN
Freq: Once | INTRAVENOUS | Status: AC
  Filled 2019-08-21: qty 250

## 2019-08-21 MED ORDER — SODIUM CHLORIDE 0.9 % IV SOLN
510.0000 mg | Freq: Once | INTRAVENOUS | Status: AC
  Administered 2019-08-21: 510 mg via INTRAVENOUS
  Filled 2019-08-21: qty 510

## 2019-08-21 NOTE — Progress Notes (Signed)
Pt. Refused to wait 30 minutes post infusion. Released stable and ASX. 

## 2019-08-21 NOTE — Patient Instructions (Signed)

## 2019-08-29 ENCOUNTER — Inpatient Hospital Stay: Payer: Commercial Managed Care - PPO

## 2019-08-29 ENCOUNTER — Ambulatory Visit: Payer: PRIVATE HEALTH INSURANCE

## 2019-09-03 ENCOUNTER — Other Ambulatory Visit: Payer: PRIVATE HEALTH INSURANCE

## 2019-09-03 ENCOUNTER — Ambulatory Visit: Payer: PRIVATE HEALTH INSURANCE | Admitting: Internal Medicine

## 2019-09-03 ENCOUNTER — Ambulatory Visit: Payer: PRIVATE HEALTH INSURANCE

## 2019-09-04 ENCOUNTER — Other Ambulatory Visit
Admission: RE | Admit: 2019-09-04 | Discharge: 2019-09-04 | Disposition: A | Discharge status: Home / Self Care | Payer: Commercial Managed Care - PPO | Source: Ambulatory Visit | Attending: Gastroenterology | Admitting: Gastroenterology

## 2019-09-04 ENCOUNTER — Other Ambulatory Visit: Payer: Self-pay

## 2019-09-04 DIAGNOSIS — Z01812 Encounter for preprocedural laboratory examination: Secondary | ICD-10-CM | POA: Insufficient documentation

## 2019-09-04 DIAGNOSIS — Z20822 Contact with and (suspected) exposure to covid-19: Secondary | ICD-10-CM | POA: Diagnosis not present

## 2019-09-04 LAB — SARS CORONAVIRUS 2 (TAT 6-24 HRS): SARS Coronavirus 2: NEGATIVE

## 2019-09-05 ENCOUNTER — Encounter: Payer: Self-pay | Admitting: Family Medicine

## 2019-09-05 ENCOUNTER — Encounter: Payer: Self-pay | Admitting: Family

## 2019-09-06 ENCOUNTER — Other Ambulatory Visit: Payer: Self-pay | Admitting: Gastroenterology

## 2019-09-06 ENCOUNTER — Other Ambulatory Visit: Payer: Self-pay

## 2019-09-06 ENCOUNTER — Ambulatory Visit
Admission: RE | Admit: 2019-09-06 | Discharge: 2019-09-06 | Disposition: A | Discharge status: Home / Self Care | Payer: Commercial Managed Care - PPO | Attending: Gastroenterology | Admitting: Gastroenterology

## 2019-09-06 ENCOUNTER — Ambulatory Visit: Payer: Commercial Managed Care - PPO | Admitting: Certified Registered"

## 2019-09-06 ENCOUNTER — Encounter: Payer: Self-pay | Admitting: Gastroenterology

## 2019-09-06 ENCOUNTER — Encounter
Admission: RE | Disposition: A | Discharge status: Home / Self Care | Payer: Self-pay | Source: Home / Self Care | Attending: Gastroenterology

## 2019-09-06 DIAGNOSIS — D509 Iron deficiency anemia, unspecified: Secondary | ICD-10-CM | POA: Diagnosis present

## 2019-09-06 DIAGNOSIS — Z79899 Other long term (current) drug therapy: Secondary | ICD-10-CM | POA: Insufficient documentation

## 2019-09-06 DIAGNOSIS — D518 Other vitamin B12 deficiency anemias: Secondary | ICD-10-CM

## 2019-09-06 DIAGNOSIS — K5669 Other partial intestinal obstruction: Secondary | ICD-10-CM | POA: Diagnosis not present

## 2019-09-06 DIAGNOSIS — Z8261 Family history of arthritis: Secondary | ICD-10-CM | POA: Diagnosis not present

## 2019-09-06 DIAGNOSIS — F419 Anxiety disorder, unspecified: Secondary | ICD-10-CM | POA: Diagnosis not present

## 2019-09-06 DIAGNOSIS — F17210 Nicotine dependence, cigarettes, uncomplicated: Secondary | ICD-10-CM | POA: Diagnosis not present

## 2019-09-06 DIAGNOSIS — Z8249 Family history of ischemic heart disease and other diseases of the circulatory system: Secondary | ICD-10-CM | POA: Insufficient documentation

## 2019-09-06 DIAGNOSIS — Z9884 Bariatric surgery status: Secondary | ICD-10-CM | POA: Insufficient documentation

## 2019-09-06 DIAGNOSIS — Z8349 Family history of other endocrine, nutritional and metabolic diseases: Secondary | ICD-10-CM | POA: Diagnosis not present

## 2019-09-06 DIAGNOSIS — K9189 Other postprocedural complications and disorders of digestive system: Secondary | ICD-10-CM | POA: Diagnosis not present

## 2019-09-06 HISTORY — PX: ESOPHAGOGASTRODUODENOSCOPY (EGD) WITH PROPOFOL: SHX5813

## 2019-09-06 HISTORY — PX: COLONOSCOPY WITH PROPOFOL: SHX5780

## 2019-09-06 LAB — IRON AND TIBC
Iron: 44 ug/dL (ref 28–170)
Saturation Ratios: 16 % (ref 10.4–31.8)
TIBC: 273 ug/dL (ref 250–450)
UIBC: 229 ug/dL

## 2019-09-06 LAB — FERRITIN: Ferritin: 66 ng/mL (ref 11–307)

## 2019-09-06 LAB — VITAMIN B12: Vitamin B-12: 138 pg/mL — ABNORMAL LOW (ref 180–914)

## 2019-09-06 LAB — POCT PREGNANCY, URINE: Preg Test, Ur: NEGATIVE

## 2019-09-06 SURGERY — COLONOSCOPY WITH PROPOFOL
Anesthesia: General

## 2019-09-06 MED ORDER — GLYCOPYRROLATE 0.2 MG/ML IJ SOLN
INTRAMUSCULAR | Status: DC | PRN
  Administered 2019-09-06: .2 mg via INTRAVENOUS

## 2019-09-06 MED ORDER — PROPOFOL 10 MG/ML IV BOLUS
INTRAVENOUS | Status: DC | PRN
  Administered 2019-09-06: 20 mg via INTRAVENOUS
  Administered 2019-09-06 (×2): 10 mg via INTRAVENOUS
  Administered 2019-09-06: 60 mg via INTRAVENOUS
  Administered 2019-09-06: 20 mg via INTRAVENOUS

## 2019-09-06 MED ORDER — LIDOCAINE HCL (CARDIAC) PF 100 MG/5ML IV SOSY
PREFILLED_SYRINGE | INTRAVENOUS | Status: DC | PRN
  Administered 2019-09-06: 100 mg via INTRATRACHEAL

## 2019-09-06 MED ORDER — PHENYLEPHRINE HCL (PRESSORS) 10 MG/ML IV SOLN
INTRAVENOUS | Status: DC | PRN
  Administered 2019-09-06: 100 ug via INTRAVENOUS

## 2019-09-06 MED ORDER — SODIUM CHLORIDE 0.9 % IV SOLN: INTRAVENOUS | Status: DC

## 2019-09-06 MED ORDER — PROPOFOL 500 MG/50ML IV EMUL
INTRAVENOUS | Status: DC | PRN
  Administered 2019-09-06: 175 ug/kg/min via INTRAVENOUS

## 2019-09-06 MED ORDER — CYANOCOBALAMIN 1000 MCG/ML IJ SOLN: 1000.0000 ug | INTRAMUSCULAR | 0 refills | Status: DC

## 2019-09-06 NOTE — Op Note (Signed)
[facility] Gastroenterology Patient Name: Madeline Cannon Procedure Date: 09/06/2019 11:07 AM MRN: [id number] Account #: [id number] Date of Birth: 1975-09-15 Admit Type: Outpatient Age: 44 Room: [facility] ENDO ROOM 4 Gender: Female Note Status: Finalized Procedure:             Colonoscopy Indications:           Iron deficiency anemia Providers:             Lin Landsman MD, MD Referring MD:          Jobe Marker. Einar Pheasant (Referring MD) Medicines:             Monitored Anesthesia Care Complications:         No immediate complications. Estimated blood loss: None. Procedure:             Pre-Anesthesia Assessment:                        - Prior to the procedure, a History and Physical was                         performed, and patient medications and allergies were                         reviewed. The patient is competent. The risks and                         benefits of the procedure and the sedation options and                         risks were discussed with the patient. All questions                         were answered and informed consent was obtained.                         Patient identification and proposed procedure were                         verified by the physician, the nurse, the                         anesthesiologist, the anesthetist and the technician                         in the pre-procedure area in the procedure room in the                         endoscopy suite. Mental Status Examination: alert and                         oriented. Airway Examination: normal oropharyngeal                         airway and neck mobility. Respiratory Examination:                         clear to auscultation. CV Examination: normal.  Prophylactic Antibiotics: The patient does not require                         prophylactic antibiotics. Prior Anticoagulants: The                         patient has taken no previous anticoagulant or                   antiplatelet agents. ASA Grade Assessment: II - A                         patient with mild systemic disease. After reviewing                         the risks and benefits, the patient was deemed in                         satisfactory condition to undergo the procedure. The                         anesthesia plan was to use monitored anesthesia care                         (MAC). Immediately prior to administration of                         medications, the patient was re-assessed for adequacy                         to receive sedatives. The heart rate, respiratory                         rate, oxygen saturations, blood pressure, adequacy of                         pulmonary ventilation, and response to care were                         monitored throughout the procedure. The physical                         status of the patient was re-assessed after the                         procedure.                        After obtaining informed consent, the colonoscope was                         passed under direct vision. Throughout the procedure,                         the patient's blood pressure, pulse, and oxygen                         saturations were monitored continuously. The  Colonoscope was introduced through the anus and                         advanced to the the terminal ileum, with                         identification of the appendiceal orifice and IC                         valve. The colonoscopy was unusually difficult due to                         significant looping and the patient's body habitus.                         Successful completion of the procedure was aided by                         applying abdominal pressure. The patient tolerated the                         procedure well. The quality of the bowel preparation                         was poor. Findings:      The terminal ileum appeared normal.      Normal mucosa  was found in the entire colon.      The retroflexed view of the distal rectum and anal verge was normal and       showed no anal or rectal abnormalities. Impression:            - Preparation of the colon was poor.                        - The examined portion of the ileum was normal.                        - Normal mucosa in the entire examined colon.                        - The distal rectum and anal verge are normal on                         retroflexion view.                        - No specimens collected. Recommendation:        - Discharge patient to home (with escort).                        - Resume previous diet today.                        - Continue present medications.                        - Repeat colonoscopy at age 48 for screening purposes.                        -  Return to GI office as previously scheduled. Procedure Code(s):     --- Professional ---                        [phone], Colonoscopy, flexible; diagnostic, including                         collection of specimen(s) by brushing or washing, when                         performed (separate procedure) Diagnosis Code(s):     --- Professional ---                        D50.9, Iron deficiency anemia, unspecified CPT copyright 2019 American Medical Association. All rights reserved. The codes documented in this report are preliminary and upon coder review may  be revised to meet current compliance requirements. Dr. Ulyess Mort Lin Landsman MD, MD 09/06/2019 11:56:58 AM This report has been signed electronically. Number of Addenda: 0 Note Initiated On: 09/06/2019 11:07 AM Scope Withdrawal Time: 0 hours 8 minutes 2 seconds  Total Procedure Duration: 0 hours 15 minutes 52 seconds  Estimated Blood Loss:  Estimated blood loss: none.      [facility]

## 2019-09-06 NOTE — Anesthesia Postprocedure Evaluation (Signed)
Anesthesia Post Note  Patient: Madeline Cannon  Procedure(s) Performed: COLONOSCOPY WITH PROPOFOL (N/A ) ESOPHAGOGASTRODUODENOSCOPY (EGD) WITH PROPOFOL (N/A )  Patient location during evaluation: Endoscopy Anesthesia Type: General Level of consciousness: awake and alert Pain management: pain level controlled Vital Signs Assessment: post-procedure vital signs reviewed and stable Respiratory status: spontaneous breathing, nonlabored ventilation, respiratory function stable and patient connected to nasal cannula oxygen Cardiovascular status: blood pressure returned to baseline and stable Postop Assessment: no apparent nausea or vomiting Anesthetic complications: no     Last Vitals:  Vitals:   09/06/19 1209 09/06/19 1219  BP:    Pulse:    Resp: 16 20  Temp:    SpO2:      Last Pain:  Vitals:   09/06/19 1219  TempSrc:   PainSc: 0-No pain                 Corinda Gubler

## 2019-09-06 NOTE — Telephone Encounter (Signed)
lvm asking patient to call office °

## 2019-09-06 NOTE — H&P (Signed)
Arlyss Repress, MD [address]  [address]  [place], [place] [address]  Main: [phone]  Fax: [phone] Pager: [phone]  Primary Care Physician:  Lynnda Child, MD Primary Gastroenterologist:  Dr. Arlyss Repress  Pre-Procedure History & Physical: HPI:  Madeline Cannon is a 44 y.o. female is here for an endoscopy and colonoscopy.   History reviewed. No pertinent past medical history.  Past Surgical History:  Procedure Laterality Date  . CHOLECYSTECTOMY  2000  . GASTRIC BYPASS  2014    Prior to Admission medications   Medication Sig Start Date End Date Taking? Authorizing Provider  Cyanocobalamin (VITAMIN B-12) 1000 MCG SUBL Place under the tongue.   Yes [provider]  sertraline (ZOLOFT) 100 MG tablet Take 1 tablet (100 mg total) by mouth daily. 02/14/19  Yes Lynnda Child, MD  sertraline (ZOLOFT) 25 MG tablet Take 1 tablet (25 mg total) by mouth daily. 02/14/19  Yes Lynnda Child, MD  B Complex-C (B-COMPLEX WITH VITAMIN C) tablet Take 1 tablet by mouth daily. Patient not taking: Reported on 09/06/2019 02/26/19   Lynnda Child, MD  bisacodyl (BISACODYL) 5 MG EC tablet Take 2 tablets (10mg ) by mouth the day before your procedure between 1pm-3pm. Patient not taking: Reported on 09/06/2019 08/02/19   14/10/20, MD  Multiple Vitamins-Minerals (BARIATRIC MULTIVITAMINS/IRON) CAPS Take 1 tablet by mouth daily. 08/09/19   [place], 08/11/19, NP  Nutritional Supplements (VITAMIN D BOOSTER PO) Take by mouth.    [provider]  vitamin B-12 (CYANOCOBALAMIN) 500 MCG tablet Take 1 tablet (500 mcg total) by mouth daily. Patient not taking: Reported on 09/06/2019 02/26/19   04/29/19, MD    Allergies as of 08/03/2019  . (No Known Allergies)    Family History  Problem Relation Age of Onset  . Diabetes Father   . Hypertension Father   . Hyperlipidemia Father   . Arthritis Maternal Grandmother   . Stomach cancer Maternal Grandmother   .  Asthma Maternal Grandfather   . Diabetes Maternal Grandfather   . Heart attack Maternal Grandfather 80  . Heart disease Maternal Grandfather   . Hypertension Maternal Grandfather   . Depression Paternal Grandmother   . Hypertension Paternal Grandmother   . Hyperlipidemia Paternal Grandmother   . Diabetes Paternal Grandmother   . Depression Paternal Grandfather     Social History   Socioeconomic History  . Marital status: Single    Spouse name: 14/06/2019  . Number of children: Not on file  . Years of education: bachelors  . Highest education level: Not on file  Occupational History  . Not on file  Tobacco Use  . Smoking status: Current Every Day Smoker    Packs/day: 0.50    Years: 20.00    Pack years: 10.00    Types: Cigarettes    Start date: 02/13/1997  . Smokeless tobacco: Never Used  Substance and Sexual Activity  . Alcohol use: Never  . Drug use: Never  . Sexual activity: Yes    Birth control/protection: None  Other Topics Concern  . 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    Social Determinants of Health   Financial Resource Strain: Low Risk   . Difficulty of Paying Living Expenses: Not hard at all  Food Insecurity:   . Worried About Charity fundraiser in the Last Year: Not on file  . Ran Out of Food in the Last Year: Not on file  Transportation Needs:   . Lack of  Transportation (Medical): Not on file  . Lack of Transportation (Non-Medical): Not on file  Physical Activity:   . Days of Exercise per Week: Not on file  . Minutes of Exercise per Session: Not on file  Stress:   . Feeling of Stress : Not on file  Social Connections:   . Frequency of Communication with Friends and Family: Not on file  . Frequency of Social Gatherings with Friends and Family: Not on file  . Attends Religious Services: Not on file  . Active Member of Clubs or Organizations: Not on file  . Attends Archivist Meetings: Not on file  . Marital Status: Not on file  Intimate Partner Violence:   . Fear of Current or Ex-Partner: Not on file  . Emotionally Abused: Not on file  . Physically Abused: Not on file  . Sexually Abused: Not on file    Review of Systems: See HPI, otherwise negative ROS  Physical Exam: BP 112/74   Pulse 64   Temp (!) 97.3 F (36.3 C) (Temporal)   Resp 18   Ht 5\' 10"  (1.778 m)   Wt 74.8 kg   SpO2 100%   BMI 23.68 kg/m  General:   Alert,  pleasant and cooperative in NAD Head:  Normocephalic and atraumatic. Neck:  Supple; no masses or thyromegaly. Lungs:  Clear throughout to auscultation.    Heart:  Regular rate and rhythm. Abdomen:  Soft, nontender and nondistended. Normal bowel sounds, without guarding, and without rebound.   Neurologic:  Alert and  oriented x4;  grossly normal neurologically.  Impression/Plan: Madeline Cannon is here for an endoscopy and colonoscopy to be performed for IDA  Risks, benefits, limitations, and alternatives regarding  endoscopy and colonoscopy have been reviewed with the patient.  Questions have been answered.  All parties agreeable.   Sherri Sear, MD  09/06/2019, 10:03 AM

## 2019-09-06 NOTE — Transfer of Care (Signed)
Immediate Anesthesia Transfer of Care Note  Patient: Madeline Cannon  Procedure(s) Performed: COLONOSCOPY WITH PROPOFOL (N/A ) ESOPHAGOGASTRODUODENOSCOPY (EGD) WITH PROPOFOL (N/A )  Patient Location: Endoscopy Unit  Anesthesia Type:General  Level of Consciousness: awake, drowsy and patient cooperative  Airway & Oxygen Therapy: Patient Spontanous Breathing and Patient connected to face mask oxygen  Post-op Assessment: Report given to RN and Post -op Vital signs reviewed and stable  Post vital signs: Reviewed and stable  Last Vitals:  Vitals Value Taken Time  BP 97/57 09/06/19 1201  Temp    Pulse    Resp 12 09/06/19 1201  SpO2    Vitals shown include unvalidated device data.  Last Pain:  Vitals:   09/06/19 1159  TempSrc: Temporal  PainSc: Asleep         Complications: No apparent anesthesia complications

## 2019-09-06 NOTE — Op Note (Signed)
[facility] Gastroenterology Patient Name: Madeline Cannon Procedure Date: 09/06/2019 11:08 AM MRN: [id number] Account #: [id number] Date of Birth: 07/31/1976 Admit Type: Outpatient Age: 44 Room: [facility] ENDO ROOM 4 Gender: Female Note Status: Finalized Procedure:             Upper GI endoscopy Indications:           Unexplained iron deficiency anemia, Status post                         Roux-en-Y Providers:             Lin Landsman MD, MD Referring MD:          Jobe Marker. Einar Pheasant (Referring MD) Medicines:             Monitored Anesthesia Care Complications:         No immediate complications. Estimated blood loss: None. Procedure:             Pre-Anesthesia Assessment:                        - Prior to the procedure, a History and Physical was                         performed, and patient medications and allergies were                         reviewed. The patient is competent. The risks and                         benefits of the procedure and the sedation options and                         risks were discussed with the patient. All questions                         were answered and informed consent was obtained.                         Patient identification and proposed procedure were                         verified by the physician, the nurse, the                         anesthesiologist, the anesthetist and the technician                         in the pre-procedure area in the procedure room in the                         endoscopy suite. Mental Status Examination: alert and                         oriented. Airway Examination: normal oropharyngeal                         airway and neck mobility. Respiratory Examination:  clear to auscultation. CV Examination: normal.                         Prophylactic Antibiotics: The patient does not require                         prophylactic antibiotics. Prior Anticoagulants: The              patient has taken no previous anticoagulant or                         antiplatelet agents. ASA Grade Assessment: II - A                         patient with mild systemic disease. After reviewing                         the risks and benefits, the patient was deemed in                         satisfactory condition to undergo the procedure. The                         anesthesia plan was to use monitored anesthesia care                         (MAC). Immediately prior to administration of                         medications, the patient was re-assessed for adequacy                         to receive sedatives. The heart rate, respiratory                         rate, oxygen saturations, blood pressure, adequacy of                         pulmonary ventilation, and response to care were                         monitored throughout the procedure. The physical                         status of the patient was re-assessed after the                         procedure.                        After obtaining informed consent, the endoscope was                         passed under direct vision. Throughout the procedure,                         the patient's blood pressure, pulse, and oxygen  saturations were monitored continuously. The Endoscope                         was introduced through the mouth, and advanced to the                         efferent jejunal loop. The upper GI endoscopy was                         accomplished without difficulty. The patient tolerated                         the procedure well. Findings:      Evidence of a Roux-en-Y gastrojejunostomy was found. The gastrojejunal       anastomosis was characterized by mild stenosis. This was traversed with       moderate resistance. The pouch-to-jejunum limb was characterized by       healthy appearing mucosa. The duodenum-to-jejunum limb was not examined       as it could not be reached. A  TTS dilator was passed through the scope.       Dilation with an 03-31-09 mm and a 06-03-11 mm anastomotic balloon dilator       was performed to 63m. The dilation site was examined following       endoscope reinsertion and showed complete resolution of luminal       narrowing. Estimated blood loss: none.      The examined jejunum was normal. Biopsies were taken with a cold forceps       for histology.      Normal mucosa was found in the gastric pouch. Biopsies were taken with a       cold forceps for Helicobacter pylori testing.      Esophagogastric landmarks were identified: the gastroesophageal junction       was found at 40 cm from the incisors.      The gastroesophageal junction and examined esophagus were normal. 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 Dilated.                        - Normal examined jejunum. Biopsied.                        - Normal mucosa was found in the entire stomach.                         Biopsied.                        - Esophagogastric landmarks identified.                        - Normal gastroesophageal junction and esophagus. Recommendation:        - Await pathology results.                        - Return to GI clinic as previously scheduled.                        -  Proceed with colonoscopy as scheduled                        See colonoscopy report Procedure Code(s):     --- Professional ---                        [phone], Esophagogastroduodenoscopy, flexible,                         transoral; with dilation of gastric/duodenal                         stricture(s) (eg, balloon, bougie)                        43239, 69, Esophagogastroduodenoscopy, flexible,                         transoral; with biopsy, single or multiple Diagnosis Code(s):     --- Professional ---                        Z98.0, Intestinal bypass and anastomosis status                         D50.9, Iron deficiency anemia, unspecified CPT copyright 2019 American Medical Association. All rights reserved. The codes documented in this report are preliminary and upon coder review may  be revised to meet current compliance requirements. Dr. Ulyess Mort Lin Landsman MD, MD 09/06/2019 11:37:31 AM This report has been signed electronically. Number of Addenda: 0 Note Initiated On: 09/06/2019 11:08 AM Estimated Blood Loss:  Estimated blood loss was minimal.      [facility]

## 2019-09-06 NOTE — Anesthesia Preprocedure Evaluation (Signed)
Anesthesia Evaluation  Patient identified by MRN, date of birth, ID band Patient awake    Reviewed: Allergy & Precautions, NPO status , Patient's Chart, lab work & pertinent test results  Airway Mallampati: II  TM Distance: >3 FB     Dental   Pulmonary Current Smoker and Patient abstained from smoking.,    Pulmonary exam normal        Cardiovascular negative cardio ROS Normal cardiovascular exam     Neuro/Psych PSYCHIATRIC DISORDERS Anxiety negative neurological ROS     GI/Hepatic Neg liver ROS,   Endo/Other  negative endocrine ROS  Renal/GU negative Renal ROS  negative genitourinary   Musculoskeletal negative musculoskeletal ROS (+)   Abdominal Normal abdominal exam  (+)   Peds negative pediatric ROS (+)  Hematology  (+) anemia ,   Anesthesia Other Findings   Reproductive/Obstetrics                             Anesthesia Physical Anesthesia Plan  ASA: II  Anesthesia Plan: General   Post-op Pain Management:    Induction: Intravenous  PONV Risk Score and Plan: Propofol infusion  Airway Management Planned:   Additional Equipment:   Intra-op Plan:   Post-operative Plan:   Informed Consent: I have reviewed the patients History and Physical, chart, labs and discussed the procedure including the risks, benefits and alternatives for the proposed anesthesia with the patient or authorized representative who has indicated his/her understanding and acceptance.     Dental advisory given  Plan Discussed with: CRNA and Surgeon  Anesthesia Plan Comments:         Anesthesia Quick Evaluation

## 2019-09-07 LAB — SURGICAL PATHOLOGY

## 2019-09-08 LAB — COPPER, SERUM: Copper: 90 ug/dL (ref 80–158)

## 2019-09-10 ENCOUNTER — Telehealth: Payer: Self-pay | Admitting: Family

## 2019-09-10 NOTE — Telephone Encounter (Signed)
Patient called in requesting iron infusion.  Per Eleonore Chiquito based on recent labs she does not need infusion at this time.  She explained also that her GI MD prvoided her w/ script in order for her to self administer B12 injections at home. Per Eleonore Chiquito NP she just needs to keep her Feb appts as scheduled.  She was in agreement.

## 2019-10-01 ENCOUNTER — Encounter: Payer: Self-pay | Admitting: Family

## 2019-10-01 ENCOUNTER — Inpatient Hospital Stay: Payer: Commercial Managed Care - PPO

## 2019-10-01 ENCOUNTER — Inpatient Hospital Stay: Payer: Commercial Managed Care - PPO | Attending: Family | Admitting: Family

## 2019-10-01 ENCOUNTER — Other Ambulatory Visit: Payer: Self-pay

## 2019-10-01 ENCOUNTER — Telehealth: Payer: Self-pay | Admitting: Family

## 2019-10-01 VITALS — BP 110/78 | HR 70 | Temp 98.3°F | Resp 18 | Ht 70.0 in | Wt 176.8 lb

## 2019-10-01 DIAGNOSIS — E538 Deficiency of other specified B group vitamins: Secondary | ICD-10-CM | POA: Diagnosis not present

## 2019-10-01 DIAGNOSIS — R200 Anesthesia of skin: Secondary | ICD-10-CM | POA: Insufficient documentation

## 2019-10-01 DIAGNOSIS — Z79899 Other long term (current) drug therapy: Secondary | ICD-10-CM | POA: Insufficient documentation

## 2019-10-01 DIAGNOSIS — K912 Postsurgical malabsorption, not elsewhere classified: Secondary | ICD-10-CM | POA: Insufficient documentation

## 2019-10-01 DIAGNOSIS — Z9884 Bariatric surgery status: Secondary | ICD-10-CM | POA: Insufficient documentation

## 2019-10-01 DIAGNOSIS — M79643 Pain in unspecified hand: Secondary | ICD-10-CM | POA: Insufficient documentation

## 2019-10-01 DIAGNOSIS — D508 Other iron deficiency anemias: Secondary | ICD-10-CM | POA: Insufficient documentation

## 2019-10-01 LAB — CBC WITH DIFFERENTIAL (CANCER CENTER ONLY)
Abs Immature Granulocytes: 0.06 10*3/uL (ref 0.00–0.07)
Basophils Absolute: 0.1 10*3/uL (ref 0.0–0.1)
Basophils Relative: 1 %
Eosinophils Absolute: 0.3 10*3/uL (ref 0.0–0.5)
Eosinophils Relative: 3 %
HCT: 43.7 % (ref 36.0–46.0)
Hemoglobin: 13.4 g/dL (ref 12.0–15.0)
Immature Granulocytes: 1 %
Lymphocytes Relative: 23 %
Lymphs Abs: 1.9 10*3/uL (ref 0.7–4.0)
MCH: 26.3 pg (ref 26.0–34.0)
MCHC: 30.7 g/dL (ref 30.0–36.0)
MCV: 85.9 fL (ref 80.0–100.0)
Monocytes Absolute: 0.5 10*3/uL (ref 0.1–1.0)
Monocytes Relative: 6 %
Neutro Abs: 5.4 10*3/uL (ref 1.7–7.7)
Neutrophils Relative %: 66 %
Platelet Count: 228 10*3/uL (ref 150–400)
RBC: 5.09 MIL/uL (ref 3.87–5.11)
RDW: 19.7 % — ABNORMAL HIGH (ref 11.5–15.5)
WBC Count: 8.2 10*3/uL (ref 4.0–10.5)
nRBC: 0 % (ref 0.0–0.2)

## 2019-10-01 LAB — RETICULOCYTES
Immature Retic Fract: 5.5 % (ref 2.3–15.9)
RBC.: 5.07 MIL/uL (ref 3.87–5.11)
Retic Count, Absolute: 31.4 10*3/uL (ref 19.0–186.0)
Retic Ct Pct: 0.6 % (ref 0.4–3.1)

## 2019-10-01 LAB — IRON AND TIBC
Iron: 77 ug/dL (ref 41–142)
Saturation Ratios: 24 % (ref 21–57)
TIBC: 316 ug/dL (ref 236–444)
UIBC: 239 ug/dL (ref 120–384)

## 2019-10-01 LAB — FERRITIN: Ferritin: 19 ng/mL (ref 11–307)

## 2019-10-01 NOTE — Telephone Encounter (Signed)
Called and spoke with patient regarding appointments added per 2/8 los

## 2019-10-01 NOTE — Progress Notes (Signed)
Hematology and Oncology Follow Up Visit  Madeline Cannon [id number] September 06, 1975 43 y.o. 10/01/2019   Principle Diagnosis:  Iron deficiency anemia  B 12 deficiency   Current Therapy:   IV iron as indicated  B 12 injection 1,000 mcg every other week, self injects   Interim History:  Madeline Cannon is here today for follow-up. She is doing well but still has some numbness, tingling and aching in her hands as well as generalized joint aches and pains.  She had a colonoscopy in January which was negative.  She started B 12 injections at home weekly for the last 4 weeks and is now doing every other week.  Her cycle is still regular and very heavy. No other blood loss noted. No bruising or petechiae.  No fever, chills, n/v, cough, rash, dizziness, SOB, chest pain, palpitations, abdominal pain or changes in bowel or bladder habits.  No falls or syncope.  She has maintained a good appetite and is staying well hydrated. Her weight is stable.    ECOG Performance Status: 1 - Symptomatic but completely ambulatory  Medications:  Allergies as of 10/01/2019   No Known Allergies     Medication List       Accurate as of October 01, 2019 10:32 AM. If you have any questions, ask your nurse or doctor.        B-complex with vitamin C tablet Take 1 tablet by mouth daily.   Bariatric Multivitamins/Iron Caps Take 1 tablet by mouth daily.   bisacodyl 5 MG EC tablet Generic drug: bisacodyl Take 2 tablets (10mg ) by mouth the day before your procedure between 1pm-3pm.   sertraline 100 MG tablet Commonly known as: ZOLOFT Take 1 tablet (100 mg total) by mouth daily.   sertraline 25 MG tablet Commonly known as: ZOLOFT Take 1 tablet (25 mg total) by mouth daily.   Vitamin B-12 1000 MCG Subl Place under the tongue.   vitamin B-12 500 MCG tablet Commonly known as: CYANOCOBALAMIN Take 1 tablet (500 mcg total) by mouth daily.   cyanocobalamin 1000 MCG/ML injection Commonly known as: (VITAMIN  B-12) Inject 1 mL (1,000 mcg total) into the muscle once a week. Weekly for 4 weeks then alternate week for another 4weeks then once every 4weeks   VITAMIN D BOOSTER PO Take by mouth.       Allergies: No Known Allergies  Past Medical History, Surgical history, Social history, and Family History were reviewed and updated.  Review of Systems: All other 10 point review of systems is negative.   Physical Exam:  vitals were not taken for this visit.   Wt Readings from Last 3 Encounters:  09/06/19 165 lb (74.8 kg)  08/06/19 173 lb 12.8 oz (78.8 kg)  08/02/19 173 lb 2 oz (78.5 kg)    Ocular: Sclerae unicteric, pupils equal, round and reactive to light Ear-nose-throat: Oropharynx clear, dentition fair Lymphatic: No cervical or supraclavicular adenopathy Lungs no rales or rhonchi, good excursion bilaterally Heart regular rate and rhythm, no murmur appreciated Abd soft, nontender, positive bowel sounds, no liver or spleen tip palpated on exam, no fluid wave  MSK no focal spinal tenderness, no joint edema Neuro: non-focal, well-oriented, appropriate affect Breasts: Deferred   Lab Results  Component Value Date   WBC 6.0 08/06/2019   HGB 10.2 (L) 08/06/2019   HCT 36.6 08/06/2019   MCV 76.6 (L) 08/06/2019   PLT 242 08/06/2019   Lab Results  Component Value Date   FERRITIN 66 09/06/2019   IRON 44  09/06/2019   TIBC 273 09/06/2019   UIBC 229 09/06/2019   IRONPCTSAT 16 09/06/2019   Lab Results  Component Value Date   RETICCTPCT <0.4 (L) 08/06/2019   RBC 4.79 08/06/2019   No results found for: KPAFRELGTCHN, LAMBDASER, KAPLAMBRATIO No results found for: IGGSERUM, IGA, IGMSERUM No results found for: [name], SPEI   Chemistry      Component Value Date/Time   NA 140 08/06/2019 1452   K 3.6 08/06/2019 1452   CL 104 08/06/2019 1452   CO2 27 08/06/2019 1452   BUN 13 08/06/2019 1452   CREATININE 0.75 08/06/2019 1452       Component Value Date/Time   CALCIUM 9.1 08/06/2019 1452   ALKPHOS 72 08/06/2019 1452   AST 16 08/06/2019 1452   ALT 13 08/06/2019 1452   BILITOT 0.3 08/06/2019 1452       Impression and Plan: Madeline Cannon is a very pleasant 44 yo caucasian female with 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  We will see what her iron studies look like and replace if needed.  We will plan to see her back in another 3 months. She can contact our office with any questions or concerns. We can certainly see her sooner if needed.   Emeline Gins, NP 2/8/202110:32 AM

## 2019-10-10 ENCOUNTER — Ambulatory Visit (INDEPENDENT_AMBULATORY_CARE_PROVIDER_SITE_OTHER): Payer: Commercial Managed Care - PPO | Admitting: Family Medicine

## 2019-10-10 ENCOUNTER — Other Ambulatory Visit: Payer: Self-pay

## 2019-10-10 ENCOUNTER — Encounter: Payer: Self-pay | Admitting: Family Medicine

## 2019-10-10 DIAGNOSIS — N920 Excessive and frequent menstruation with regular cycle: Secondary | ICD-10-CM | POA: Diagnosis not present

## 2019-10-10 DIAGNOSIS — Z720 Tobacco use: Secondary | ICD-10-CM

## 2019-10-10 DIAGNOSIS — Z3009 Encounter for other general counseling and advice on contraception: Secondary | ICD-10-CM | POA: Diagnosis not present

## 2019-10-10 DIAGNOSIS — D500 Iron deficiency anemia secondary to blood loss (chronic): Secondary | ICD-10-CM

## 2019-10-10 MED ORDER — NORETHINDRONE 0.35 MG PO TABS: 1.0000 | ORAL_TABLET | Freq: Every day | ORAL | 11 refills | Status: DC

## 2019-10-10 NOTE — Patient Instructions (Signed)
Medroxyprogesterone injection [Contraceptive] What is this medicine? MEDROXYPROGESTERONE (me DROX ee proe JES te rone) contraceptive injections prevent pregnancy. They provide effective birth control for 3 months. Depo-subQ Provera 104 is also used for treating pain related to endometriosis. This medicine may be used for other purposes; ask your health care provider or pharmacist if you have questions. COMMON BRAND NAME(S): Depo-Provera, Depo-subQ Provera 104 What should I tell my health care provider before I take this medicine? They need to know if you have any of these conditions:  frequently drink alcohol  asthma  blood vessel disease or a history of a blood clot in the lungs or legs  bone disease such as osteoporosis  breast cancer  diabetes  eating disorder (anorexia nervosa or bulimia)  high blood pressure  HIV infection or AIDS  kidney disease  liver disease  mental depression  migraine  seizures (convulsions)  stroke  tobacco smoker  vaginal bleeding  an unusual or allergic reaction to medroxyprogesterone, other hormones, medicines, foods, dyes, or preservatives  pregnant or trying to get pregnant  breast-feeding How should I use this medicine? Depo-Provera Contraceptive injection is given into a muscle. Depo-subQ Provera 104 injection is given under the skin. These injections are given by a health care professional. You must not be pregnant before getting an injection. The injection is usually given during the first 5 days after the start of a menstrual period or 6 weeks after delivery of a baby. Talk to your pediatrician regarding the use of this medicine in children. Special care may be needed. These injections have been used in female children who have started having menstrual periods. Overdosage: If you think you have taken too much of this medicine contact a poison control center or emergency room at once. NOTE: This medicine is only for you. Do not  share this medicine with others. What if I miss a dose? Try not to miss a dose. You must get an injection once every 3 months to maintain birth control. If you cannot keep an appointment, call and reschedule it. If you wait longer than 13 weeks between Depo-Provera contraceptive injections or longer than 14 weeks between Depo-subQ Provera 104 injections, you could get pregnant. Use another method for birth control if you miss your appointment. You may also need a pregnancy test before receiving another injection. What may interact with this medicine? Do not take this medicine with any of the following medications:  bosentan This medicine may also interact with the following medications:  aminoglutethimide  antibiotics or medicines for infections, especially rifampin, rifabutin, rifapentine, and griseofulvin  aprepitant  barbiturate medicines such as phenobarbital or primidone  bexarotene  carbamazepine  medicines for seizures like ethotoin, felbamate, oxcarbazepine, phenytoin, topiramate  modafinil  St. John's wort This list may not describe all possible interactions. Give your health care provider a list of all the medicines, herbs, non-prescription drugs, or dietary supplements you use. Also tell them if you smoke, drink alcohol, or use illegal drugs. Some items may interact with your medicine. What should I watch for while using this medicine? This drug does not protect you against HIV infection (AIDS) or other sexually transmitted diseases. Use of this product may cause you to lose calcium from your bones. Loss of calcium may cause weak bones (osteoporosis). Only use this product for more than 2 years if other forms of birth control are not right for you. The longer you use this product for birth control the more likely you will be at risk   for weak bones. Ask your health care professional how you can keep strong bones. You may have a change in bleeding pattern or irregular periods.  Many females stop having periods while taking this drug. If you have received your injections on time, your chance of being pregnant is very low. If you think you may be pregnant, see your health care professional as soon as possible. Tell your health care professional if you want to get pregnant within the next year. The effect of this medicine may last a long time after you get your last injection. What side effects may I notice from receiving this medicine? Side effects that you should report to your doctor or health care professional as soon as possible:  allergic reactions like skin rash, itching or hives, swelling of the face, lips, or tongue  breast tenderness or discharge  breathing problems  changes in vision  depression  feeling faint or lightheaded, falls  fever  pain in the abdomen, chest, groin, or leg  problems with balance, talking, walking  unusually weak or tired  yellowing of the eyes or skin Side effects that usually do not require medical attention (report to your doctor or health care professional if they continue or are bothersome):  acne  fluid retention and swelling  headache  irregular periods, spotting, or absent periods  temporary pain, itching, or skin reaction at site where injected  weight gain This list may not describe all possible side effects. Call your doctor for medical advice about side effects. You may report side effects to FDA at 1-800-FDA-1088. Where should I keep my medicine? This does not apply. The injection will be given to you by a health care professional. NOTE: This sheet is a summary. It may not cover all possible information. If you have questions about this medicine, talk to your doctor, pharmacist, or health care provider.  2020 Elsevier/Gold Standard (2008-08-30 18:37:56)   Levonorgestrel intrauterine device (IUD) What is this medicine? LEVONORGESTREL IUD (LEE voe nor jes trel) is a contraceptive (birth control)  device. The device is placed inside the uterus by a healthcare professional. It is used to prevent pregnancy. This device can also be used to treat heavy bleeding that occurs during your period. This medicine may be used for other purposes; ask your health care provider or pharmacist if you have questions. COMMON BRAND NAME(S): Cameron Ali What should I tell my health care provider before I take this medicine? They need to know if you have any of these conditions:  abnormal Pap smear  cancer of the breast, uterus, or cervix  diabetes  endometritis  genital or pelvic infection now or in the past  have more than one sexual partner or your partner has more than one partner  heart disease  history of an ectopic or tubal pregnancy  immune system problems  IUD in place  liver disease or tumor  problems with blood clots or take blood-thinners  seizures  use intravenous drugs  uterus of unusual shape  vaginal bleeding that has not been explained  an unusual or allergic reaction to levonorgestrel, other hormones, silicone, or polyethylene, medicines, foods, dyes, or preservatives  pregnant or trying to get pregnant  breast-feeding How should I use this medicine? This device is placed inside the uterus by a health care professional. Talk to your pediatrician regarding the use of this medicine in children. Special care may be needed. Overdosage: If you think you have taken too much of this medicine contact  a poison control center or emergency room at once. NOTE: This medicine is only for you. Do not share this medicine with others. What if I miss a dose? This does not apply. Depending on the brand of device you have inserted, the device will need to be replaced every 3 to 6 years if you wish to continue using this type of birth control. What may interact with this medicine? Do not take this medicine with any of the following  medications:  amprenavir  bosentan  fosamprenavir This medicine may also interact with the following medications:  aprepitant  armodafinil  barbiturate medicines for inducing sleep or treating seizures  bexarotene  boceprevir  griseofulvin  medicines to treat seizures like carbamazepine, ethotoin, felbamate, oxcarbazepine, phenytoin, topiramate  modafinil  pioglitazone  rifabutin  rifampin  rifapentine  some medicines to treat HIV infection like atazanavir, efavirenz, indinavir, lopinavir, nelfinavir, tipranavir, ritonavir  St. John's wort  warfarin This list may not describe all possible interactions. Give your health care provider a list of all the medicines, herbs, non-prescription drugs, or dietary supplements you use. Also tell them if you smoke, drink alcohol, or use illegal drugs. Some items may interact with your medicine. What should I watch for while using this medicine? Visit your doctor or health care professional for regular check ups. See your doctor if you or your partner has sexual contact with others, becomes HIV positive, or gets a sexual transmitted disease. This product does not protect you against HIV infection (AIDS) or other sexually transmitted diseases. You can check the placement of the IUD yourself by reaching up to the top of your vagina with clean fingers to feel the threads. Do not pull on the threads. It is a good habit to check placement after each menstrual period. Call your doctor right away if you feel more of the IUD than just the threads or if you cannot feel the threads at all. The IUD may come out by itself. You may become pregnant if the device comes out. If you notice that the IUD has come out use a backup birth control method like condoms and call your health care provider. Using tampons will not change the position of the IUD and are okay to use during your period. This IUD can be safely scanned with magnetic resonance imaging  (MRI) only under specific conditions. Before you have an MRI, tell your healthcare provider that you have an IUD in place, and which type of IUD you have in place. What side effects may I notice from receiving this medicine? Side effects that you should report to your doctor or health care professional as soon as possible:  allergic reactions like skin rash, itching or hives, swelling of the face, lips, or tongue  fever, flu-like symptoms  genital sores  high blood pressure  no menstrual period for 6 weeks during use  pain, swelling, warmth in the leg  pelvic pain or tenderness  severe or sudden headache  signs of pregnancy  stomach cramping  sudden shortness of breath  trouble with balance, talking, or walking  unusual vaginal bleeding, discharge  yellowing of the eyes or skin Side effects that usually do not require medical attention (report to your doctor or health care professional if they continue or are bothersome):  acne  breast pain  change in sex drive or performance  changes in weight  cramping, dizziness, or faintness while the device is being inserted  headache  irregular menstrual bleeding within first 3 to  6 months of use  nausea This list may not describe all possible side effects. Call your doctor for medical advice about side effects. You may report side effects to FDA at 1-800-FDA-1088. Where should I keep my medicine? This does not apply. NOTE: This sheet is a summary. It may not cover all possible information. If you have questions about this medicine, talk to your doctor, pharmacist, or health care provider.  2020 Elsevier/Gold Standard (2018-06-20 13:22:01)

## 2019-10-10 NOTE — Assessment & Plan Note (Signed)
Anemia likely 2/2 to both heavy menses and intake from prior gastric bypass surgery. Pt would like to try birth control to decrease bleeding. Discussed options and elected to try minipill.

## 2019-10-10 NOTE — Progress Notes (Addendum)
I connected with Madeline Cannon on 10/10/19 at  8:00 AM EST by video and verified that I am speaking with the correct person using two identifiers.   I discussed the limitations, risks, security and privacy concerns of performing an evaluation and management service by video and the availability of in person appointments. I also discussed with the patient that there may be a patient responsible charge related to this service. The patient expressed understanding and agreed to proceed.  Patient location: Home Provider Location: [facility] Participants: Lynnda Child and Madeline Cannon   Subjective:     Madeline Cannon is a 44 y.o. female presenting for Discuss birth control     HPI  #Birth Control - diagnosed with anemia - has heavy menses - notices that she has been fatigued and weak - wondering if there is something that will slow down her periods - is sexually active - not currently using condoms - has PCOS - so more difficulty to become pregnant - Currently on her period - started on Saturday  #Hand pain - thinks it has been getting better since the anemia has been treated  Review of Systems   Social History   Tobacco Use  Smoking Status Current Every Day Smoker  . Packs/day: 0.50  . Years: 20.00  . Pack years: 10.00  . Types: Cigarettes  . Start date: 02/13/1997  Smokeless Tobacco Never Used        Objective:   BP Readings from Last 3 Encounters:  10/01/19 110/78  09/06/19 (!) 97/57  08/21/19 120/69   Wt Readings from Last 3 Encounters:  10/01/19 176 lb 12.8 oz (80.2 kg)  09/06/19 165 lb (74.8 kg)  08/06/19 173 lb 12.8 oz (78.8 kg)   LMP 10/06/2019    Physical Exam Constitutional:      Appearance: Normal appearance. She is not ill-appearing.  HENT:     Head: Normocephalic and atraumatic.     Right Ear: External ear normal.     Left Ear: External ear normal.  Eyes:     Conjunctiva/sclera: Conjunctivae normal.  Pulmonary:   Effort: Pulmonary effort is normal. No respiratory distress.  Neurological:     Mental Status: She is alert. Mental status is at baseline.  Psychiatric:        Mood and Affect: Mood normal.        Behavior: Behavior normal.        Thought Content: Thought content normal.        Judgment: Judgment normal.             Assessment & Plan:   Problem List Items Addressed This Visit      Other   Iron deficiency anemia   Tobacco use    Discussed that due to tobacco use and age would not recommend estrogen containing birth control option.       Birth control counseling - Primary    Discussed progesterone containing options including IUD, Depo, and minipill. Pt would like to try the minipill. Discussed common side effect of breakthrough bleeding. Pt will try and follow-up if she has side effects or wants to try a different method.       Relevant Medications   norethindrone (MICRONOR) 0.35 MG tablet   Menorrhagia with regular cycle    Anemia likely 2/2 to both heavy menses and intake from prior gastric bypass surgery. Pt would like to try birth control to decrease bleeding. Discussed options and elected to try minipill.  Return if symptoms worsen or fail to improve.  Lesleigh Noe, MD

## 2019-10-10 NOTE — Assessment & Plan Note (Signed)
Discussed that due to tobacco use and age would not recommend estrogen containing birth control option.

## 2019-10-10 NOTE — Assessment & Plan Note (Signed)
Discussed progesterone containing options including IUD, Depo, and minipill. Pt would like to try the minipill. Discussed common side effect of breakthrough bleeding. Pt will try and follow-up if she has side effects or wants to try a different method.

## 2019-12-28 ENCOUNTER — Telehealth: Payer: Self-pay | Admitting: Hematology & Oncology

## 2019-12-28 ENCOUNTER — Encounter: Payer: Self-pay | Admitting: Family

## 2019-12-28 ENCOUNTER — Telehealth: Payer: Self-pay | Admitting: Family

## 2019-12-28 ENCOUNTER — Other Ambulatory Visit: Payer: Self-pay

## 2019-12-28 ENCOUNTER — Inpatient Hospital Stay (HOSPITAL_BASED_OUTPATIENT_CLINIC_OR_DEPARTMENT_OTHER): Payer: Managed Care, Other (non HMO) | Admitting: Family

## 2019-12-28 ENCOUNTER — Inpatient Hospital Stay: Payer: Managed Care, Other (non HMO) | Attending: Family

## 2019-12-28 VITALS — BP 117/67 | HR 86 | Temp 97.8°F | Resp 18 | Ht 70.0 in | Wt 185.1 lb

## 2019-12-28 DIAGNOSIS — J329 Chronic sinusitis, unspecified: Secondary | ICD-10-CM | POA: Insufficient documentation

## 2019-12-28 DIAGNOSIS — K912 Postsurgical malabsorption, not elsewhere classified: Secondary | ICD-10-CM | POA: Diagnosis not present

## 2019-12-28 DIAGNOSIS — D518 Other vitamin B12 deficiency anemias: Secondary | ICD-10-CM | POA: Diagnosis not present

## 2019-12-28 DIAGNOSIS — R05 Cough: Secondary | ICD-10-CM | POA: Diagnosis not present

## 2019-12-28 DIAGNOSIS — Z79899 Other long term (current) drug therapy: Secondary | ICD-10-CM | POA: Insufficient documentation

## 2019-12-28 DIAGNOSIS — E538 Deficiency of other specified B group vitamins: Secondary | ICD-10-CM | POA: Insufficient documentation

## 2019-12-28 DIAGNOSIS — M7989 Other specified soft tissue disorders: Secondary | ICD-10-CM | POA: Insufficient documentation

## 2019-12-28 DIAGNOSIS — R200 Anesthesia of skin: Secondary | ICD-10-CM | POA: Insufficient documentation

## 2019-12-28 DIAGNOSIS — Z9884 Bariatric surgery status: Secondary | ICD-10-CM | POA: Insufficient documentation

## 2019-12-28 DIAGNOSIS — R0989 Other specified symptoms and signs involving the circulatory and respiratory systems: Secondary | ICD-10-CM | POA: Diagnosis not present

## 2019-12-28 DIAGNOSIS — D508 Other iron deficiency anemias: Secondary | ICD-10-CM

## 2019-12-28 DIAGNOSIS — R5383 Other fatigue: Secondary | ICD-10-CM | POA: Insufficient documentation

## 2019-12-28 LAB — CBC WITH DIFFERENTIAL (CANCER CENTER ONLY)
Abs Immature Granulocytes: 0.14 10*3/uL — ABNORMAL HIGH (ref 0.00–0.07)
Basophils Absolute: 0.1 10*3/uL (ref 0.0–0.1)
Basophils Relative: 1 %
Eosinophils Absolute: 0.2 10*3/uL (ref 0.0–0.5)
Eosinophils Relative: 3 %
HCT: 41.3 % (ref 36.0–46.0)
Hemoglobin: 13.1 g/dL (ref 12.0–15.0)
Immature Granulocytes: 2 %
Lymphocytes Relative: 13 %
Lymphs Abs: 0.9 10*3/uL (ref 0.7–4.0)
MCH: 27.9 pg (ref 26.0–34.0)
MCHC: 31.7 g/dL (ref 30.0–36.0)
MCV: 88.1 fL (ref 80.0–100.0)
Monocytes Absolute: 0.7 10*3/uL (ref 0.1–1.0)
Monocytes Relative: 10 %
Neutro Abs: 5.1 10*3/uL (ref 1.7–7.7)
Neutrophils Relative %: 71 %
Platelet Count: 228 10*3/uL (ref 150–400)
RBC: 4.69 MIL/uL (ref 3.87–5.11)
RDW: 14.4 % (ref 11.5–15.5)
WBC Count: 7.1 10*3/uL (ref 4.0–10.5)
nRBC: 0 % (ref 0.0–0.2)

## 2019-12-28 LAB — RETICULOCYTES
Immature Retic Fract: 9 % (ref 2.3–15.9)
RBC.: 4.67 MIL/uL (ref 3.87–5.11)
Retic Count, Absolute: 64.9 10*3/uL (ref 19.0–186.0)
Retic Ct Pct: 1.4 % (ref 0.4–3.1)

## 2019-12-28 LAB — VITAMIN B12: Vitamin B-12: 133 pg/mL — ABNORMAL LOW (ref 180–914)

## 2019-12-28 LAB — IRON AND TIBC
Iron: 19 ug/dL — ABNORMAL LOW (ref 41–142)
Saturation Ratios: 6 % — ABNORMAL LOW (ref 21–57)
TIBC: 325 ug/dL (ref 236–444)
UIBC: 306 ug/dL (ref 120–384)

## 2019-12-28 LAB — FERRITIN: Ferritin: 46 ng/mL (ref 11–307)

## 2019-12-28 MED ORDER — CYANOCOBALAMIN 1000 MCG/ML IJ SOLN: 1000.0000 ug | INTRAMUSCULAR | 0 refills | Status: DC

## 2019-12-28 NOTE — Telephone Encounter (Signed)
LMVM appointments scheduled calendar mailed per 5/6 los

## 2019-12-28 NOTE — Telephone Encounter (Signed)
Appointments scheduled calendar printed & mailed per 5/7 los

## 2019-12-28 NOTE — Progress Notes (Signed)
Hematology and Oncology Follow Up Visit  Madeline Cannon [id number] 22-Jan-1976 44 y.o. 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 She has a sinus infection with cough and congestion. She is feeling fatigued.  Her cycle has become irregular starting, stopping and restarting several days later.  No other blood loss noted. No bruising or petechiae.  No fever, chills, n/v, rash, dizziness, SOB, chest pain, palpitations, abdominal pain or changes in bowel or bladder habits.  No episodes of bleeding. No bruising or petechiae.  She has noted swelling and aching in her hands and ankles that waxes and wanes. She will contact her PCP for further work-up if this persists.  She still has numbness and tingling in her hands and feet. This is unchanged.  No falls or syncopal episodes to report.  She has maintained a good appetite and is staying well hydrated. Her weight is stable.   ECOG Performance Status: 1 - Symptomatic but completely ambulatory  Medications:  Allergies as of 12/28/2019   No Known Allergies     Medication List       Accurate as of Dec 28, 2019  9:23 AM. If you have any questions, ask your nurse or doctor.        STOP taking these medications   B-complex with vitamin C tablet Stopped by: Madeline Gins, NP   VITAMIN D BOOSTER PO Stopped by: Madeline Gins, NP     TAKE these medications   Bariatric Multivitamins/Iron Caps Take 1 tablet by mouth daily.   cyanocobalamin 1000 MCG/ML injection Commonly known as: (VITAMIN B-12) Inject 1 mL (1,000 mcg total) into the muscle once a week. Weekly for 4 weeks then alternate week for another 4weeks then once every 4weeks   norethindrone 0.35 MG tablet Commonly known as: MICRONOR Take 1 tablet (0.35 mg total) by mouth daily.     sertraline 100 MG tablet Commonly known as: ZOLOFT Take 1 tablet (100 mg total) by mouth daily.   sertraline 25 MG tablet Commonly known as: ZOLOFT Take 1 tablet (25 mg total) by mouth daily.       Allergies: No Known Allergies  Past Medical History, Surgical history, Social history, and Family History were reviewed and updated.  Review of Systems: All other 10 point review of systems is negative.   Physical Exam:  vitals were not taken for this visit.   Wt Readings from Last 3 Encounters:  10/01/19 176 lb 12.8 oz (80.2 kg)  09/06/19 165 lb (74.8 kg)  08/06/19 173 lb 12.8 oz (78.8 kg)    Ocular: Sclerae unicteric, pupils equal, round and reactive to light Ear-nose-throat: Oropharynx clear, dentition fair Lymphatic: No cervical or supraclavicular adenopathy Lungs no rales or rhonchi, good excursion bilaterally Heart regular rate and rhythm, no murmur appreciated Abd soft, nontender, positive bowel sounds, no liver or spleen tip palpated on exam, no fluid wave  MSK no focal spinal tenderness, no joint edema Neuro: non-focal, well-oriented, appropriate affect Breasts: Deferred   Lab Results  Component Value Date   WBC 7.1 12/28/2019   HGB 13.1 12/28/2019   HCT 41.3 12/28/2019   MCV 88.1 12/28/2019   PLT 228 12/28/2019   Lab Results  Component Value Date   FERRITIN 19 10/01/2019   IRON 77 10/01/2019   TIBC  316 10/01/2019   UIBC 239 10/01/2019   IRONPCTSAT 24 10/01/2019   Lab Results  Component Value Date   RETICCTPCT 1.4 12/28/2019   RBC 4.67 12/28/2019   No results found for: KPAFRELGTCHN, LAMBDASER, KAPLAMBRATIO No results found for: IGGSERUM, IGA, IGMSERUM No results found for: Odetta Pink, SPEI   Chemistry      Component Value Date/Time   NA 140 08/06/2019 1452   K 3.6 08/06/2019 1452   CL 104 08/06/2019 1452   CO2 27 08/06/2019 1452   BUN 13 08/06/2019 1452   CREATININE 0.75 08/06/2019 1452       Component Value Date/Time   CALCIUM 9.1 08/06/2019 1452   ALKPHOS 72 08/06/2019 1452   AST 16 08/06/2019 1452   ALT 13 08/06/2019 1452   BILITOT 0.3 08/06/2019 1452      Impression and Plan: Madeline Cannon is a very pleasant 11 yocaucasian female with 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  We will see what her iron studies look like and replace if needed.  B 12 refilled.  We will plan to see her back in another 4 months. She can contact our office with any questions or concerns. We can certainly see her sooner if needed.   [name], NP 5/7/20219:24 AM

## 2019-12-31 ENCOUNTER — Telehealth: Payer: Self-pay | Admitting: Family

## 2019-12-31 NOTE — Telephone Encounter (Signed)
I called and spoke with patient regarding appointments added.  She needs to check her schedule to be sure there are no conflicts w/ work.  She will call back later to advise. Per 5/10 sch msg

## 2020-01-08 ENCOUNTER — Other Ambulatory Visit: Payer: Self-pay | Admitting: Family

## 2020-01-08 ENCOUNTER — Other Ambulatory Visit: Payer: Self-pay

## 2020-01-08 ENCOUNTER — Inpatient Hospital Stay: Payer: Managed Care, Other (non HMO)

## 2020-01-08 VITALS — BP 97/70 | HR 67 | Temp 97.7°F | Resp 18

## 2020-01-08 DIAGNOSIS — D508 Other iron deficiency anemias: Secondary | ICD-10-CM | POA: Diagnosis not present

## 2020-01-08 MED ORDER — SODIUM CHLORIDE 0.9 % IV SOLN
510.0000 mg | Freq: Once | INTRAVENOUS | Status: AC
  Administered 2020-01-08: 510 mg via INTRAVENOUS
  Filled 2020-01-08: qty 510

## 2020-01-08 MED ORDER — SODIUM CHLORIDE 0.9 % IV SOLN
Freq: Once | INTRAVENOUS | Status: AC
  Filled 2020-01-08: qty 250

## 2020-01-08 NOTE — Patient Instructions (Signed)

## 2020-01-08 NOTE — Progress Notes (Signed)
Pt. Refused to wait 30 minutes post infusion. Released stable and ASX. 

## 2020-01-10 ENCOUNTER — Telehealth: Payer: Self-pay | Admitting: Family

## 2020-01-10 NOTE — Telephone Encounter (Signed)
Called and LMVM for patient regarding appointments scheduled per 5/10 sch msg & 5/20 secure chat

## 2020-01-15 ENCOUNTER — Inpatient Hospital Stay: Payer: Managed Care, Other (non HMO)

## 2020-01-18 ENCOUNTER — Ambulatory Visit: Payer: Managed Care, Other (non HMO)

## 2020-01-23 ENCOUNTER — Other Ambulatory Visit: Payer: Self-pay | Admitting: Family

## 2020-03-06 ENCOUNTER — Other Ambulatory Visit: Payer: Self-pay | Admitting: Family Medicine

## 2020-03-06 DIAGNOSIS — F410 Panic disorder [episodic paroxysmal anxiety] without agoraphobia: Secondary | ICD-10-CM

## 2020-04-29 ENCOUNTER — Encounter: Payer: Self-pay | Admitting: Family

## 2020-04-29 ENCOUNTER — Other Ambulatory Visit: Payer: Self-pay

## 2020-04-29 ENCOUNTER — Telehealth: Payer: Self-pay | Admitting: Family

## 2020-04-29 ENCOUNTER — Inpatient Hospital Stay (HOSPITAL_BASED_OUTPATIENT_CLINIC_OR_DEPARTMENT_OTHER): Payer: Managed Care, Other (non HMO) | Admitting: Family

## 2020-04-29 ENCOUNTER — Inpatient Hospital Stay: Payer: Managed Care, Other (non HMO) | Attending: Hematology & Oncology

## 2020-04-29 VITALS — BP 117/77 | HR 70 | Temp 98.1°F | Resp 18 | Ht 70.0 in | Wt 191.0 lb

## 2020-04-29 DIAGNOSIS — D518 Other vitamin B12 deficiency anemias: Secondary | ICD-10-CM

## 2020-04-29 DIAGNOSIS — R202 Paresthesia of skin: Secondary | ICD-10-CM | POA: Insufficient documentation

## 2020-04-29 DIAGNOSIS — Z9884 Bariatric surgery status: Secondary | ICD-10-CM | POA: Diagnosis not present

## 2020-04-29 DIAGNOSIS — D508 Other iron deficiency anemias: Secondary | ICD-10-CM

## 2020-04-29 DIAGNOSIS — E538 Deficiency of other specified B group vitamins: Secondary | ICD-10-CM | POA: Insufficient documentation

## 2020-04-29 DIAGNOSIS — M7989 Other specified soft tissue disorders: Secondary | ICD-10-CM | POA: Insufficient documentation

## 2020-04-29 DIAGNOSIS — K909 Intestinal malabsorption, unspecified: Secondary | ICD-10-CM | POA: Diagnosis not present

## 2020-04-29 DIAGNOSIS — D509 Iron deficiency anemia, unspecified: Secondary | ICD-10-CM | POA: Insufficient documentation

## 2020-04-29 DIAGNOSIS — Z79899 Other long term (current) drug therapy: Secondary | ICD-10-CM | POA: Insufficient documentation

## 2020-04-29 DIAGNOSIS — R5383 Other fatigue: Secondary | ICD-10-CM | POA: Diagnosis not present

## 2020-04-29 DIAGNOSIS — R200 Anesthesia of skin: Secondary | ICD-10-CM | POA: Insufficient documentation

## 2020-04-29 LAB — CBC WITH DIFFERENTIAL (CANCER CENTER ONLY)
Abs Immature Granulocytes: 0.02 10*3/uL (ref 0.00–0.07)
Basophils Absolute: 0 10*3/uL (ref 0.0–0.1)
Basophils Relative: 0 %
Eosinophils Absolute: 0.2 10*3/uL (ref 0.0–0.5)
Eosinophils Relative: 3 %
HCT: 44.3 % (ref 36.0–46.0)
Hemoglobin: 14.4 g/dL (ref 12.0–15.0)
Immature Granulocytes: 0 %
Lymphocytes Relative: 22 %
Lymphs Abs: 1.7 10*3/uL (ref 0.7–4.0)
MCH: 29 pg (ref 26.0–34.0)
MCHC: 32.5 g/dL (ref 30.0–36.0)
MCV: 89.1 fL (ref 80.0–100.0)
Monocytes Absolute: 0.6 10*3/uL (ref 0.1–1.0)
Monocytes Relative: 7 %
Neutro Abs: 5.3 10*3/uL (ref 1.7–7.7)
Neutrophils Relative %: 68 %
Platelet Count: 263 10*3/uL (ref 150–400)
RBC: 4.97 MIL/uL (ref 3.87–5.11)
RDW: 15.2 % (ref 11.5–15.5)
WBC Count: 7.9 10*3/uL (ref 4.0–10.5)
nRBC: 0 % (ref 0.0–0.2)

## 2020-04-29 LAB — RETICULOCYTES
Immature Retic Fract: 8.3 % (ref 2.3–15.9)
RBC.: 4.97 MIL/uL (ref 3.87–5.11)
Retic Count, Absolute: 69.6 10*3/uL (ref 19.0–186.0)
Retic Ct Pct: 1.4 % (ref 0.4–3.1)

## 2020-04-29 LAB — IRON AND TIBC
Iron: 75 ug/dL (ref 41–142)
Saturation Ratios: 20 % — ABNORMAL LOW (ref 21–57)
TIBC: 375 ug/dL (ref 236–444)
UIBC: 300 ug/dL (ref 120–384)

## 2020-04-29 LAB — VITAMIN B12: Vitamin B-12: 109 pg/mL — ABNORMAL LOW (ref 180–914)

## 2020-04-29 LAB — FERRITIN: Ferritin: 15 ng/mL (ref 11–307)

## 2020-04-29 MED ORDER — CYANOCOBALAMIN 1000 MCG/ML IJ SOLN: 1000.0000 ug | INTRAMUSCULAR | 11 refills | Status: DC

## 2020-04-29 NOTE — Progress Notes (Signed)
Hematology and Oncology Follow Up Visit  Madeline Cannon [id number] 30-May-1976 44 y.o. 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 She is fatigued at times.  She is currently on her cycle and states that it is quite heavy.  No other blood loss noted.  She states that she did feel better after receiving IV iron in May.  B 12 in May was 133. She ran out and has not been doing her monthly injections. I refilled her prescription today.  She has numbness, tingling and swelling in her fingers.  No falls or syncope.  She has maintained a good appetite and is staying hydrated. Her weight is stable.  No fever, chills, n/v, cough, rash, dizziness, SOB, chest pain, palpitations, abdominal pain or changes in bowel or bladder habits.   ECOG Performance Status: 1 - Symptomatic but completely ambulatory  Medications:  Allergies as of 04/29/2020   No Known Allergies     Medication List       Accurate as of April 29, 2020  8:50 AM. If you have any questions, ask your nurse or doctor.        STOP taking these medications   norethindrone 0.35 MG tablet Commonly known as: MICRONOR Stopped by: Emeline Gins, NP     TAKE these medications   Bariatric Multivitamins/Iron Caps Take 1 tablet by mouth daily.   cyanocobalamin 1000 MCG/ML injection Commonly known as: (VITAMIN B-12) Inject 1 mL (1,000 mcg total) into the muscle every 30 (thirty) days.   sertraline 25 MG tablet Commonly known as: ZOLOFT TAKE 1 TABLET BY MOUTH EVERY DAY   sertraline 100 MG tablet Commonly known as: ZOLOFT TAKE 1 TABLET BY MOUTH EVERY DAY       Allergies: No Known Allergies  Past Medical History, Surgical history, Social history, and Family History were reviewed and updated.  Review of Systems: All  other 10 point review of systems is negative.   Physical Exam:  height is 5\' 10"  (1.778 m) and weight is 191 lb (86.6 kg). Her oral temperature is 98.1 F (36.7 C). Her blood pressure is 117/77 and her pulse is 70. Her respiration is 18 and oxygen saturation is 99%.   Wt Readings from Last 3 Encounters:  04/29/20 191 lb (86.6 kg)  12/28/19 185 lb 1.9 oz (84 kg)  10/01/19 176 lb 12.8 oz (80.2 kg)    Ocular: Sclerae unicteric, pupils equal, round and reactive to light Ear-nose-throat: Oropharynx clear, dentition fair Lymphatic: No cervical or supraclavicular adenopathy Lungs no rales or rhonchi, good excursion bilaterally Heart regular rate and rhythm, no murmur appreciated Abd soft, nontender, positive bowel sounds MSK no focal spinal tenderness, no joint edema Neuro: non-focal, well-oriented, appropriate affect Breasts: Deferred   Lab Results  Component Value Date   WBC 7.9 04/29/2020   HGB 14.4 04/29/2020   HCT 44.3 04/29/2020   MCV 89.1 04/29/2020   PLT 263 04/29/2020   Lab Results  Component Value Date   FERRITIN 46 12/28/2019   IRON 19 (L) 12/28/2019   TIBC 325 12/28/2019   UIBC 306 12/28/2019   IRONPCTSAT 6 (L) 12/28/2019   Lab Results  Component Value Date   RETICCTPCT 1.4 04/29/2020   RBC 4.97 04/29/2020   RBC 4.97 04/29/2020  No results found for: KPAFRELGTCHN, LAMBDASER, KAPLAMBRATIO No results found for: IGGSERUM, IGA, IGMSERUM No results found for: Madeline Cannon, SPEI   Chemistry      Component Value Date/Time   NA 140 08/06/2019 1452   K 3.6 08/06/2019 1452   CL 104 08/06/2019 1452   CO2 27 08/06/2019 1452   BUN 13 08/06/2019 1452   CREATININE 0.75 08/06/2019 1452      Component Value Date/Time   CALCIUM 9.1 08/06/2019 1452   ALKPHOS 72 08/06/2019 1452   AST 16 08/06/2019 1452   ALT 13 08/06/2019 1452   BILITOT 0.3 08/06/2019 1452       Impression and Plan: Madeline Cannon is a very  pleasant 25 yocaucasian female with 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 We will see what her iron studies look like and replace if needed.  B12 prescription was refilled.  She will follow up with her PCP regarding the swelling in the joints of her fingers.  We will see her again in another 4 months.  She can contact our office with any questions or concerns.  Emeline Gins, NP 9/7/20218:50 AM

## 2020-04-29 NOTE — Telephone Encounter (Signed)
Appointments scheduled and VM was left for patient as well. Per 9/7 los

## 2020-05-05 ENCOUNTER — Ambulatory Visit: Payer: Managed Care, Other (non HMO)

## 2020-05-12 ENCOUNTER — Inpatient Hospital Stay: Payer: Managed Care, Other (non HMO)

## 2020-05-31 ENCOUNTER — Other Ambulatory Visit: Payer: Self-pay | Admitting: Family Medicine

## 2020-05-31 DIAGNOSIS — F411 Generalized anxiety disorder: Secondary | ICD-10-CM

## 2020-05-31 DIAGNOSIS — F410 Panic disorder [episodic paroxysmal anxiety] without agoraphobia: Secondary | ICD-10-CM

## 2020-07-04 ENCOUNTER — Other Ambulatory Visit: Payer: Self-pay

## 2020-07-04 ENCOUNTER — Encounter: Payer: Self-pay | Admitting: Family Medicine

## 2020-07-04 ENCOUNTER — Ambulatory Visit: Payer: Managed Care, Other (non HMO) | Admitting: Family Medicine

## 2020-07-04 VITALS — BP 118/74 | HR 81 | Temp 97.7°F | Ht 70.0 in | Wt 195.4 lb

## 2020-07-04 DIAGNOSIS — N760 Acute vaginitis: Secondary | ICD-10-CM | POA: Diagnosis not present

## 2020-07-04 DIAGNOSIS — N920 Excessive and frequent menstruation with regular cycle: Secondary | ICD-10-CM | POA: Diagnosis not present

## 2020-07-04 DIAGNOSIS — Z113 Encounter for screening for infections with a predominantly sexual mode of transmission: Secondary | ICD-10-CM

## 2020-07-04 LAB — POCT WET PREP (WET MOUNT): Trichomonas Wet Prep HPF POC: ABSENT

## 2020-07-04 MED ORDER — FLUCONAZOLE 150 MG PO TABS: ORAL_TABLET | ORAL | 0 refills | Status: DC

## 2020-07-04 NOTE — Patient Instructions (Addendum)
Take the diflucan as directed  Alert [place] if symptoms do not improve  Other tests will take a little longer to come back  We will alert you when they return   Keep up posted   Discuss contraception/ menses with Dr Selena Batten

## 2020-07-04 NOTE — Assessment & Plan Note (Signed)
Pt has some vaginal d/c  Was diag with yeast Does not use condoms and just had period  HIV and RPR and Hep c along with gc/chlam sent  Will update  Disc std prevention

## 2020-07-04 NOTE — Assessment & Plan Note (Signed)
Intolerant of mini pill  May want to consider prog IUD Will d/w her pcp

## 2020-07-04 NOTE — Progress Notes (Signed)
Subjective:    Patient ID: Madeline Cannon, female    DOB: 1976/05/27, 44 y.o.   MRN: [id number]  This visit occurred during the SARS-CoV-2 public health emergency.  Safety protocols were in place, including screening questions prior to the visit, additional usage of staff PPE, and extensive cleaning of exam room while observing appropriate contact time as indicated for disinfecting solutions.    HPI 44 yo pt of Dr Selena Batten presents with vaginitis symptoms and desires std screen   Wt Readings from Last 3 Encounters:  07/04/20 195 lb 6 oz (88.6 kg)  04/29/20 191 lb (86.6 kg)  12/28/19 185 lb 1.9 oz (84 kg)   28.03 kg/m  Having a lot of vaginal d/c   More than normal  Towards end of period-had tampon with yellow/green d/c with odor   occ itching /burning  No pelvic pain   No known exposure to std  No blisters or sores   No condoms  One partner Not very worried about pregnancy due to pcos   Has a h/o pcos and gastric bypass in the past as well as menorrhagia   Contraception  Tried the progesterone only pill -made her emotional    Current smoker  Smokes 1/2 ppd  No breathing symptoms Cannot have estrogen contraception   LMP 11/6 Menses is heavy /sometimes painful   Wet prep today  Results for orders placed or performed in visit on 07/04/20  POCT Wet Prep [employer] [place])  Result Value Ref Range   Source Wet Prep POC vaginal    WBC, Wet Prep HPF POC few    Bacteria Wet Prep HPF POC Few Few   BACTERIA WET PREP MORPHOLOGY POC     Clue Cells Wet Prep HPF POC None None   Clue Cells Wet Prep Whiff POC     Yeast Wet Prep HPF POC Moderate (A) None   KOH Wet Prep POC Moderate (A) None   Trichomonas Wet Prep HPF POC Absent Absent     Patient Active Problem List   Diagnosis Date Noted  . Screen for STD (sexually transmitted disease) 07/04/2020  . Vaginitis 07/04/2020  . Tobacco use 10/10/2019  . Birth control counseling 10/10/2019  . Menorrhagia with regular cycle 10/10/2019    . Gastrojejunal anastomotic stricture   . Acquired iron deficiency anemia due to decreased absorption 07/18/2019  . Iron deficiency anemia 07/09/2019  . Thiamine deficiency 07/05/2019  . Vitamin D deficiency 07/05/2019  . Iron deficiency 07/05/2019  . Vitamin B12 deficiency 07/05/2019  . PCOS (polycystic ovarian syndrome) 02/14/2019  . Generalized anxiety disorder with panic attacks 02/14/2019  . Hx of gastric bypass 02/14/2019  . Right wrist pain 02/14/2019   History reviewed. No pertinent past medical history. Past Surgical History:  Procedure Laterality Date  . CHOLECYSTECTOMY  2000  . COLONOSCOPY WITH PROPOFOL N/A 09/06/2019   Procedure: COLONOSCOPY WITH PROPOFOL;  Surgeon: Toney Reil, MD;  Location: [facility] ENDOSCOPY;  Service: Endoscopy;  Laterality: N/A;  . ESOPHAGOGASTRODUODENOSCOPY (EGD) WITH PROPOFOL N/A 09/06/2019   Procedure: ESOPHAGOGASTRODUODENOSCOPY (EGD) WITH PROPOFOL;  Surgeon: Toney Reil, MD;  Location: [facility] ENDOSCOPY;  Service: Endoscopy;  Laterality: N/A;  . GASTRIC BYPASS  2014   Social History   Tobacco Use  . Smoking status: Current Every Day Smoker    Packs/day: 0.50    Years: 20.00    Pack years: 10.00    Types: Cigarettes    Start date: 02/13/1997  . Smokeless tobacco: Never Used  Vaping Use  .  Vaping Use: Never used  Substance Use Topics  . Alcohol use: Never  . Drug use: Never   Family History  Problem Relation Age of Onset  . Diabetes Father   . Hypertension Father   . Hyperlipidemia Father   . Arthritis Maternal Grandmother   . Stomach cancer Maternal Grandmother   . Asthma Maternal Grandfather   . Diabetes Maternal Grandfather   . Heart attack Maternal Grandfather 80  . Heart disease Maternal Grandfather   . Hypertension Maternal Grandfather   . Depression Paternal Grandmother   . Hypertension Paternal Grandmother   . Hyperlipidemia Paternal Grandmother   . Diabetes Paternal Grandmother   . Depression Paternal  Grandfather    No Known Allergies Current Outpatient Medications on File Prior to Visit  Medication Sig Dispense Refill  . cyanocobalamin (,VITAMIN B-12,) 1000 MCG/ML injection Inject 1 mL (1,000 mcg total) into the muscle every 30 (thirty) days. 1 mL 11  . Multiple Vitamins-Minerals (BARIATRIC MULTIVITAMINS/IRON) CAPS Take 1 tablet by mouth daily. 30 capsule 6  . sertraline (ZOLOFT) 100 MG tablet TAKE 1 TABLET BY MOUTH EVERY DAY 90 tablet 0  . sertraline (ZOLOFT) 25 MG tablet TAKE 1 TABLET BY MOUTH EVERY DAY 90 tablet 0   No current facility-administered medications on file prior to visit.    Review of Systems  Constitutional: Negative for activity change, appetite change, fatigue, fever and unexpected weight change.  HENT: Negative for congestion, ear pain, rhinorrhea, sinus pressure and sore throat.   Eyes: Negative for pain, redness and visual disturbance.  Respiratory: Negative for cough, shortness of breath and wheezing.   Cardiovascular: Negative for chest pain and palpitations.  Gastrointestinal: Negative for abdominal pain, blood in stool, constipation and diarrhea.  Endocrine: Negative for polydipsia and polyuria.  Genitourinary: Positive for vaginal discharge. Negative for dysuria, frequency, pelvic pain, urgency and vaginal pain.  Musculoskeletal: Negative for arthralgias, back pain and myalgias.  Skin: Negative for pallor and rash.  Allergic/Immunologic: Negative for environmental allergies.  Neurological: Negative for dizziness, syncope and headaches.  Hematological: Negative for adenopathy. Does not bruise/bleed easily.  Psychiatric/Behavioral: Negative for decreased concentration and dysphoric mood. The patient is not nervous/anxious.        Objective:   Physical Exam Constitutional:      General: She is not in acute distress.    Appearance: Normal appearance. She is normal weight. She is not ill-appearing.  Eyes:     General:        Right eye: No discharge.         Left eye: No discharge.     Conjunctiva/sclera: Conjunctivae normal.     Pupils: Pupils are equal, round, and reactive to light.  Cardiovascular:     Rate and Rhythm: Normal rate and regular rhythm.  Pulmonary:     Effort: Pulmonary effort is normal. No respiratory distress.     Breath sounds: No wheezing.  Abdominal:     General: Abdomen is flat. There is no distension.     Tenderness: There is no abdominal tenderness.     Comments: No suprapubic tenderness or fullness    Genitourinary:    Comments:         Anus appears normal w/o hemorrhoids or masses     External genitalia : nl appearance and hair distribution/no lesions     Urethral meatus : nl size, no lesions or prolapse     Urethra: no masses, tenderness or scarring    Bladder : no masses or tenderness  Vagina: nl general appearance, scant d/c without odor, no lesions , no significant cystocele  or rectocele     Cervix: no lesions/ discharge or friability    Uterus: nl size, contour, position, and mobility (not fixed) , non tender    Adnexa : no masses, tenderness, enlargement or nodularity      wet prep swab and gc/chlam swabs obt  Musculoskeletal:     Cervical back: Normal range of motion and neck supple.  Lymphadenopathy:     Cervical: No cervical adenopathy.  Neurological:     Mental Status: She is alert.           Assessment & Plan:   Problem List Items Addressed This Visit      Genitourinary   Vaginitis - Primary    With yeast seen on wet prep  tx with diflucan 150 mg po  Now and repeat dose in 3 days  Update if not starting to improve in a week or if worsening   Std screen today also       Relevant Orders   POCT Wet Prep [employer])     Other   Menorrhagia with regular cycle    Intolerant of mini pill  May want to consider prog IUD Will d/w her pcp      Screen for STD (sexually transmitted disease)    Pt has some vaginal d/c  Was diag with yeast Does not use  condoms and just had period  HIV and RPR and Hep c along with gc/chlam sent  Will update  Disc std prevention        Relevant Orders   C. trachomatis/N. gonorrhoeae RNA   HIV Antibody (routine testing w rflx)   RPR   Hepatitis C antibody

## 2020-07-04 NOTE — Assessment & Plan Note (Signed)
With yeast seen on wet prep  tx with diflucan 150 mg po  Now and repeat dose in 3 days  Update if not starting to improve in a week or if worsening   Std screen today also

## 2020-07-07 LAB — C. TRACHOMATIS/N. GONORRHOEAE RNA
C. trachomatis RNA, TMA: NOT DETECTED
N. gonorrhoeae RNA, TMA: NOT DETECTED

## 2020-07-07 LAB — HIV ANTIBODY (ROUTINE TESTING W REFLEX): HIV 1&2 Ab, 4th Generation: NONREACTIVE

## 2020-07-07 LAB — HEPATITIS C ANTIBODY
Hepatitis C Ab: NONREACTIVE
SIGNAL TO CUT-OFF: 0.02 (ref <1.00)

## 2020-07-07 LAB — RPR: RPR Ser Ql: NONREACTIVE

## 2020-07-28 ENCOUNTER — Encounter: Payer: Self-pay | Admitting: Family Medicine

## 2020-07-28 ENCOUNTER — Other Ambulatory Visit: Payer: Self-pay

## 2020-07-28 DIAGNOSIS — F410 Panic disorder [episodic paroxysmal anxiety] without agoraphobia: Secondary | ICD-10-CM

## 2020-07-28 MED ORDER — SERTRALINE HCL 100 MG PO TABS: 100.0000 mg | ORAL_TABLET | Freq: Every day | ORAL | 0 refills | Status: DC

## 2020-07-28 MED ORDER — SERTRALINE HCL 25 MG PO TABS: 25.0000 mg | ORAL_TABLET | Freq: Every day | ORAL | 0 refills | Status: DC

## 2020-08-07 ENCOUNTER — Other Ambulatory Visit (HOSPITAL_COMMUNITY)
Admission: RE | Admit: 2020-08-07 | Discharge: 2020-08-07 | Disposition: A | Discharge status: Home / Self Care | Payer: Managed Care, Other (non HMO) | Source: Ambulatory Visit | Attending: Family Medicine | Admitting: Family Medicine

## 2020-08-07 ENCOUNTER — Encounter: Payer: Self-pay | Admitting: Family Medicine

## 2020-08-07 ENCOUNTER — Ambulatory Visit (INDEPENDENT_AMBULATORY_CARE_PROVIDER_SITE_OTHER): Payer: Managed Care, Other (non HMO) | Admitting: Family Medicine

## 2020-08-07 ENCOUNTER — Other Ambulatory Visit: Payer: Self-pay

## 2020-08-07 VITALS — BP 118/70 | HR 70 | Temp 98.0°F | Ht 69.1 in | Wt 198.5 lb

## 2020-08-07 DIAGNOSIS — Z124 Encounter for screening for malignant neoplasm of cervix: Secondary | ICD-10-CM | POA: Insufficient documentation

## 2020-08-07 DIAGNOSIS — F410 Panic disorder [episodic paroxysmal anxiety] without agoraphobia: Secondary | ICD-10-CM

## 2020-08-07 DIAGNOSIS — F411 Generalized anxiety disorder: Secondary | ICD-10-CM

## 2020-08-07 DIAGNOSIS — E282 Polycystic ovarian syndrome: Secondary | ICD-10-CM | POA: Diagnosis not present

## 2020-08-07 DIAGNOSIS — N920 Excessive and frequent menstruation with regular cycle: Secondary | ICD-10-CM | POA: Diagnosis not present

## 2020-08-07 DIAGNOSIS — E663 Overweight: Secondary | ICD-10-CM | POA: Diagnosis not present

## 2020-08-07 DIAGNOSIS — Z0000 Encounter for general adult medical examination without abnormal findings: Secondary | ICD-10-CM

## 2020-08-07 DIAGNOSIS — B3731 Acute candidiasis of vulva and vagina: Secondary | ICD-10-CM

## 2020-08-07 DIAGNOSIS — B373 Candidiasis of vulva and vagina: Secondary | ICD-10-CM

## 2020-08-07 LAB — HEMOGLOBIN A1C: Hgb A1c MFr Bld: 5.6 % (ref 4.6–6.5)

## 2020-08-07 LAB — COMPREHENSIVE METABOLIC PANEL
ALT: 11 U/L (ref 0–35)
AST: 15 U/L (ref 0–37)
Albumin: 4.1 g/dL (ref 3.5–5.2)
Alkaline Phosphatase: 89 U/L (ref 39–117)
BUN: 11 mg/dL (ref 6–23)
CO2: 26 mEq/L (ref 19–32)
Calcium: 8.9 mg/dL (ref 8.4–10.5)
Chloride: 102 mEq/L (ref 96–112)
Creatinine, Ser: 0.63 mg/dL (ref 0.40–1.20)
GFR: 109.39 mL/min (ref >60.00)
Glucose, Bld: 76 mg/dL (ref 70–99)
Potassium: 4.2 mEq/L (ref 3.5–5.1)
Sodium: 134 mEq/L — ABNORMAL LOW (ref 135–145)
Total Bilirubin: 0.4 mg/dL (ref 0.2–1.2)
Total Protein: 7.6 g/dL (ref 6.0–8.3)

## 2020-08-07 LAB — LIPID PANEL
Cholesterol: 193 mg/dL (ref 0–200)
HDL: 55 mg/dL (ref >39.00)
LDL Cholesterol: 116 mg/dL — ABNORMAL HIGH (ref 0–99)
NonHDL: 137.6
Total CHOL/HDL Ratio: 4
Triglycerides: 107 mg/dL (ref 0.0–149.0)
VLDL: 21.4 mg/dL (ref 0.0–40.0)

## 2020-08-07 MED ORDER — FLUCONAZOLE 150 MG PO TABS
150.0000 mg | ORAL_TABLET | Freq: Once | ORAL | 0 refills | Status: AC
Start: 2020-08-07 — End: 2020-08-07

## 2020-08-07 MED ORDER — SERTRALINE HCL 100 MG PO TABS: 100.0000 mg | ORAL_TABLET | Freq: Every day | ORAL | 3 refills | Status: AC

## 2020-08-07 MED ORDER — SERTRALINE HCL 25 MG PO TABS: 25.0000 mg | ORAL_TABLET | Freq: Every day | ORAL | 3 refills | Status: AC

## 2020-08-07 NOTE — Assessment & Plan Note (Signed)
Followed by hematology due to iron deficiency and b12 deficiency which are diet and blood loss related. Tried OCPs but she did not like the emotional change. Advise GYN referral to discuss options as hopefully treatment would reduce her need for iron infusions. May benefit from IUD

## 2020-08-07 NOTE — Progress Notes (Signed)
Annual Exam   Chief Complaint:  Chief Complaint  Patient presents with  . Annual Exam  . Gynecologic Exam    History of Present Illness:  [name] is a 44 y.o. No obstetric history on file. who LMP was No LMP recorded., presents today for her annual examination.     Nutrition Diet: drinking too much mountain dew, eating a lot of sugar Exercise: not currently She does not get adequate calcium and Vitamin D in her diet.   Social History   Tobacco Use  Smoking Status Current Every Day Smoker  . Packs/day: 0.50  . Years: 20.00  . Pack years: 10.00  . Types: Cigarettes  . Start date: 02/13/1997  Smokeless Tobacco Never Used   Social History   Substance and Sexual Activity  Alcohol Use Never   Social History   Substance and Sexual Activity  Drug Use Never    Safety The patient wears seatbelts: yes.     The patient feels safe at home and in their relationships: yes.  General Health Dentist in the last year: Yes Eye doctor: yes  Menstrual Her menses are regular every 28-30 days, still heavy PCOS - has a lot of hair growth  GYN She is single partner, contraception - partner with vasectomy.    Cervical Cancer Screening:   Last Pap:   November 2020 Results were: no abnormalities/ HPV DNA positive   Breast Cancer Screening There is no FH of breast cancer. There is no FH of ovarian cancer. BRCA screening Not Indicated.  Discussed that for average risk women between age 37-49 screening may reduce the risk of breast cancer death, however, at a lower rate than those over age 59. And that the the false-positive rates resulting in unnecessary biopsies with more screening is higher. The balance of benefits vs harms likely improves as you progress through your 40s. The patient does not want a mammogram this year.   Weight Wt Readings from Last 3 Encounters:  08/07/20 198 lb 8 oz (90 kg)  07/04/20 195 lb 6 oz (88.6 kg)  04/29/20 191 lb (86.6 kg)   Patient has  high BMI  BMI Readings from Last 1 Encounters:  08/07/20 29.23 kg/m     Chronic disease screening Blood pressure monitoring:  BP Readings from Last 3 Encounters:  08/07/20 118/70  07/04/20 118/74  04/29/20 117/77    Lipid Monitoring: Indication for screening: age >63, obesity, diabetes, family hx, CV risk factors.  Lipid screening: Yes  Lab Results  Component Value Date   CHOL 112 02/22/2019   HDL 35.30 (L) 02/22/2019   LDLCALC 67 02/22/2019   TRIG 51.0 02/22/2019   CHOLHDL 3 02/22/2019     Diabetes Screening: age >48, overweight, family hx, PCOS, hx of gestational diabetes, at risk ethnicity Diabetes Screening screening: Yes  Lab Results  Component Value Date   HGBA1C 5.0 02/22/2019     History reviewed. No pertinent past medical history.  Past Surgical History:  Procedure Laterality Date  . CHOLECYSTECTOMY  2000  . COLONOSCOPY WITH PROPOFOL N/A 09/06/2019   Procedure: COLONOSCOPY WITH PROPOFOL;  Surgeon: Lin Landsman, MD;  Location: [facility] ENDOSCOPY;  Service: Endoscopy;  Laterality: N/A;  . ESOPHAGOGASTRODUODENOSCOPY (EGD) WITH PROPOFOL N/A 09/06/2019   Procedure: ESOPHAGOGASTRODUODENOSCOPY (EGD) WITH PROPOFOL;  Surgeon: Lin Landsman, MD;  Location: [facility] ENDOSCOPY;  Service: Endoscopy;  Laterality: N/A;  . GASTRIC BYPASS  2014    Prior to Admission medications   Medication Sig Start Date End Date  Taking? Authorizing Provider  cyanocobalamin (,VITAMIN B-12,) 1000 MCG/ML injection Inject 1 mL (1,000 mcg total) into the muscle every 30 (thirty) days. 04/29/20  Yes [place], Holli Humbles, NP  fluconazole (DIFLUCAN) 150 MG tablet Take one pill by mouth now and repeat dose in 3 days 07/04/20  Yes [place], Wynelle Fanny, MD  Multiple Vitamins-Minerals (BARIATRIC MULTIVITAMINS/IRON) CAPS Take 1 tablet by mouth daily. 08/09/19  Yes [place], Holli Humbles, NP  sertraline (ZOLOFT) 100 MG tablet Take 1 tablet (100 mg total) by mouth daily. 07/28/20  Yes Lesleigh Noe, MD   sertraline (ZOLOFT) 25 MG tablet Take 1 tablet (25 mg total) by mouth daily. 07/28/20  Yes Lesleigh Noe, MD    No Known Allergies  Gynecologic History: No LMP recorded.  Obstetric History: No obstetric history on file.  Social History   Socioeconomic History  . Marital status: Single    Spouse name: Roger Shelter  . Number of children: Not on file  . Years of education: bachelors  . Highest education level: Not on file  Occupational History  . Not on file  Tobacco Use  . Smoking status: Current Every Day Smoker    Packs/day: 0.50    Years: 20.00    Pack years: 10.00    Types: Cigarettes    Start date: 02/13/1997  . Smokeless tobacco: Never Used  Vaping Use  . Vaping Use: Never used  Substance and Sexual Activity  . Alcohol use: Never  . Drug use: Never  . Sexual activity: Yes    Birth control/protection: None  Other Topics Concern  . 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    Social Determinants of Health   Financial Resource Strain: Not on file  Food Insecurity: Not on file  Transportation Needs: Not on file  Physical Activity: Not on file  Stress: Not on file  Social Connections: Not on file  Intimate Partner Violence: Not on file    Family History  Problem Relation Age of Onset  . Endometrial cancer Mother        stage 4  . Diabetes Father   . Hypertension Father   .  Hyperlipidemia Father   . Arthritis Maternal Grandmother   . Stomach cancer Maternal Grandmother   . Asthma Maternal Grandfather   . Diabetes Maternal Grandfather   . Heart attack Maternal Grandfather 80  . Heart disease Maternal Grandfather   . Hypertension Maternal Grandfather   . Depression Paternal Grandmother   . Hypertension Paternal Grandmother   . Hyperlipidemia Paternal Grandmother   . Diabetes Paternal Grandmother   . Depression Paternal Grandfather     Review of Systems  Constitutional: Negative for chills and fever.  HENT: Negative for congestion and sore throat.   Eyes: Negative for blurred vision and  double vision.  Respiratory: Negative for shortness of breath.   Cardiovascular: Negative for chest pain.  Gastrointestinal: Negative for heartburn, nausea and vomiting.  Genitourinary: Negative.        Vaginal discharge   Musculoskeletal: Negative.  Negative for myalgias.  Skin: Negative for rash.  Neurological: Negative for dizziness and headaches.  Endo/Heme/Allergies: Does not bruise/bleed easily.  Psychiatric/Behavioral: Negative for depression. The patient is not nervous/anxious.      Physical Exam BP 118/70   Pulse 70   Temp 98 F (36.7 C) (Temporal)   Ht 5' 9.1" (1.755 m)   Wt 198 lb 8 oz (90 kg)   SpO2 95%   BMI 29.23 kg/m    BP Readings from Last 3 Encounters:  08/07/20 118/70  07/04/20 118/74  04/29/20 117/77      Physical Exam Exam conducted with a chaperone present.  Constitutional:      General: She is not in acute distress.    Appearance: She is well-developed and well-nourished. She is not diaphoretic.  HENT:     Head: Normocephalic and atraumatic.     Right Ear: External ear normal.     Left Ear: External ear normal.     Nose: Nose normal.     Mouth/Throat:     Mouth: Oropharynx is clear and moist.  Eyes:     General: No scleral icterus.    Extraocular Movements: EOM normal.     Conjunctiva/sclera: Conjunctivae normal.   Cardiovascular:     Rate and Rhythm: Normal rate and regular rhythm.     Heart sounds: No murmur heard.   Pulmonary:     Effort: Pulmonary effort is normal. No respiratory distress.     Breath sounds: Normal breath sounds. No wheezing.  Abdominal:     General: Bowel sounds are normal. There is no distension.     Palpations: Abdomen is soft. There is no mass.     Tenderness: There is no abdominal tenderness. There is no guarding or rebound.  Genitourinary:    Vagina: Vaginal discharge (white cottage cheese like) present.     Cervix: Normal.  Musculoskeletal:        General: No edema. Normal range of motion.     Cervical back: Neck supple.  Lymphadenopathy:     Cervical: No cervical adenopathy.  Skin:    General: Skin is warm and dry.     Capillary Refill: Capillary refill takes less than 2 seconds.  Neurological:     Mental Status: She is alert and oriented to person, place, and time.     Deep Tendon Reflexes: Strength normal. Reflexes normal.  Psychiatric:        Mood and Affect: Mood and affect normal.        Behavior: Behavior normal.      Results:  PHQ-9:  [place] Office Visit from 02/14/2019 in [place] at [place]  PHQ-9 Total Score 2        Assessment: 44 y.o. No obstetric history on file. female here for routine annual physical examination.  Plan: Problem List Items Addressed This Visit      Endocrine   PCOS (polycystic ovarian syndrome)    Pt with hair growth. Will check labs but also discussed referral to OB/GYN to discuss treatment options - as already sending for menorrhagia      Relevant Orders   Hemoglobin A1c   Ambulatory referral to Obstetrics / Gynecology     Other   Generalized anxiety disorder with panic  attacks   Relevant Medications   sertraline (ZOLOFT) 100 MG tablet   sertraline (ZOLOFT) 25 MG tablet   Menorrhagia with regular cycle    Followed by hematology due to iron deficiency and b12 deficiency which are  diet and blood loss related. Tried OCPs but she did not like the emotional change. Advise GYN referral to discuss options as hopefully treatment would reduce her need for iron infusions. May benefit from IUD      Relevant Orders   Ambulatory referral to Obstetrics / Gynecology    Other Visit Diagnoses    Annual physical exam    -  Primary   Relevant Orders   Comprehensive metabolic panel   Lipid panel   Hemoglobin A1c   Overweight (BMI 25.0-29.9)       Relevant Orders   Comprehensive metabolic panel   Lipid panel   Hemoglobin A1c   Vaginal candidiasis       Relevant Medications   fluconazole (DIFLUCAN) 150 MG tablet   Cervical cancer screening       Relevant Orders   Cytology - PAP      Screening: -- Blood pressure screen normal -- cholesterol screening: will obtain -- Weight screening: overweight: continue to monitor -- Diabetes Screening: will obtain -- Nutrition: Encouraged healthy diet  The ASCVD Risk score Mikey Bussing DC Jr., et al., 2013) failed to calculate for the following reasons:   The valid total cholesterol range is 130 to 320 mg/dL  -- Statin therapy for Age 52-75 with CVD risk >7.5%  Psych -- Depression screening (PHQ-9):  [place] Visit from 02/14/2019 in [place] at [facility]  PHQ-9 Total Score 2       Safety -- tobacco screening: using - not ready to quit at this time.  -- alcohol screening:  low-risk usage. -- no evidence of domestic violence or intimate partner violence.   Cancer Screening -- pap smear collected per ASCCP guidelines - hx of HPV positive, will repeat.  -- family history of breast cancer screening: done. not at high risk. -- Mammogram - will likely wait until age 1 -- Colon cancer (age 85+)-- not indicated  Immunizations Immunization History  Administered Date(s) Administered  . Influenza Split 05/22/2013, 05/07/2014  . PFIZER SARS-COV-2 Vaccination 04/25/2020, 05/17/2020  . PPD Test 02/06/2014  . Td  09/10/2011    -- flu vaccine declined -- TDAP q10 years up to date -- Covid-19 Vaccine up to date   Encouraged healthy diet and exercise. Encouraged regular vision and dental care.   Lesleigh Noe, MD

## 2020-08-07 NOTE — Assessment & Plan Note (Signed)
Pt with hair growth. Will check labs but also discussed referral to OB/GYN to discuss treatment options - as already sending for menorrhagia

## 2020-08-07 NOTE — Patient Instructions (Addendum)
#  Referral I have placed a referral to a specialist for you. You should receive a phone call from the specialty office. Make sure your voicemail is not full and that if you are able to answer your phone to unknown or new numbers.   It may take up to 2 weeks to hear about the referral. If you do not hear anything in 2 weeks, please call our office and ask to speak with the referral coordinator.    Work on reducing sugar in your diet  Diflucan for yeast infection  Labs today

## 2020-08-11 ENCOUNTER — Encounter: Payer: Self-pay | Admitting: Family Medicine

## 2020-08-11 LAB — CYTOLOGY - PAP
Adequacy: ABSENT
Comment: NEGATIVE
Diagnosis: UNDETERMINED — AB
High risk HPV: NEGATIVE

## 2020-08-20 ENCOUNTER — Telehealth: Payer: Self-pay | Admitting: Radiology

## 2020-08-20 NOTE — Telephone Encounter (Signed)
Called patient to schedule New GYN appointment from referral for PCOS. Unable to leave voicemail - sent mychart message.

## 2020-08-29 ENCOUNTER — Encounter: Payer: Self-pay | Admitting: Family

## 2020-08-29 ENCOUNTER — Telehealth: Payer: Self-pay | Admitting: Family

## 2020-08-29 ENCOUNTER — Inpatient Hospital Stay: Payer: Managed Care, Other (non HMO) | Admitting: Family

## 2020-08-29 ENCOUNTER — Other Ambulatory Visit: Payer: Self-pay

## 2020-08-29 ENCOUNTER — Inpatient Hospital Stay: Payer: Managed Care, Other (non HMO) | Attending: Hematology & Oncology

## 2020-08-29 VITALS — BP 116/68 | HR 79 | Temp 97.7°F | Resp 17 | Ht 69.0 in | Wt 198.1 lb

## 2020-08-29 DIAGNOSIS — D518 Other vitamin B12 deficiency anemias: Secondary | ICD-10-CM | POA: Diagnosis not present

## 2020-08-29 DIAGNOSIS — Z9884 Bariatric surgery status: Secondary | ICD-10-CM | POA: Insufficient documentation

## 2020-08-29 DIAGNOSIS — D508 Other iron deficiency anemias: Secondary | ICD-10-CM

## 2020-08-29 DIAGNOSIS — Z79899 Other long term (current) drug therapy: Secondary | ICD-10-CM | POA: Diagnosis not present

## 2020-08-29 DIAGNOSIS — E538 Deficiency of other specified B group vitamins: Secondary | ICD-10-CM | POA: Insufficient documentation

## 2020-08-29 DIAGNOSIS — K909 Intestinal malabsorption, unspecified: Secondary | ICD-10-CM | POA: Insufficient documentation

## 2020-08-29 DIAGNOSIS — R5383 Other fatigue: Secondary | ICD-10-CM | POA: Diagnosis not present

## 2020-08-29 DIAGNOSIS — R200 Anesthesia of skin: Secondary | ICD-10-CM | POA: Diagnosis not present

## 2020-08-29 LAB — CBC WITH DIFFERENTIAL (CANCER CENTER ONLY)
Abs Immature Granulocytes: 0.1 10*3/uL — ABNORMAL HIGH (ref 0.00–0.07)
Basophils Absolute: 0.1 10*3/uL (ref 0.0–0.1)
Basophils Relative: 1 %
Eosinophils Absolute: 0.3 10*3/uL (ref 0.0–0.5)
Eosinophils Relative: 3 %
HCT: 43.4 % (ref 36.0–46.0)
Hemoglobin: 13.7 g/dL (ref 12.0–15.0)
Immature Granulocytes: 1 %
Lymphocytes Relative: 22 %
Lymphs Abs: 1.7 10*3/uL (ref 0.7–4.0)
MCH: 26.7 pg (ref 26.0–34.0)
MCHC: 31.6 g/dL (ref 30.0–36.0)
MCV: 84.4 fL (ref 80.0–100.0)
Monocytes Absolute: 0.5 10*3/uL (ref 0.1–1.0)
Monocytes Relative: 7 %
Neutro Abs: 5.2 10*3/uL (ref 1.7–7.7)
Neutrophils Relative %: 66 %
Platelet Count: 296 10*3/uL (ref 150–400)
RBC: 5.14 MIL/uL — ABNORMAL HIGH (ref 3.87–5.11)
RDW: 15.7 % — ABNORMAL HIGH (ref 11.5–15.5)
WBC Count: 7.8 10*3/uL (ref 4.0–10.5)
nRBC: 0 % (ref 0.0–0.2)

## 2020-08-29 LAB — RETICULOCYTES
Immature Retic Fract: 14.7 % (ref 2.3–15.9)
RBC.: 5.12 MIL/uL — ABNORMAL HIGH (ref 3.87–5.11)
Retic Count, Absolute: 69.6 10*3/uL (ref 19.0–186.0)
Retic Ct Pct: 1.4 % (ref 0.4–3.1)

## 2020-08-29 LAB — FERRITIN: Ferritin: 8 ng/mL — ABNORMAL LOW (ref 11–307)

## 2020-08-29 LAB — IRON AND TIBC
Iron: 27 ug/dL — ABNORMAL LOW (ref 41–142)
Saturation Ratios: 6 % — ABNORMAL LOW (ref 21–57)
TIBC: 443 ug/dL (ref 236–444)
UIBC: 415 ug/dL — ABNORMAL HIGH (ref 120–384)

## 2020-08-29 LAB — VITAMIN B12: Vitamin B-12: 106 pg/mL — ABNORMAL LOW (ref 180–914)

## 2020-08-29 NOTE — Progress Notes (Signed)
Hematology and Oncology Follow Up Visit  Madeline Cannon [id number] 1976/05/19 44 y.o. 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 She is doing well but still notes fatigue as well as the numbness and tingling in her hands and feet.  She states that she is due for her monthly B 12 injection and will be filling her prescription later today.  She states that her cycle has been regular with normal flow. No other blood loss noted. No petechiae.  No fever, chills, n/v, cough, rash, dizziness, chest pain, palpitations, abdominal pain or changes in bowel or bladder habits.  She occasionally gets a little winded with activity (stairs).  No swelling or tenderness in her extremities.  No falls or syncope.  She has maintained a good appetite and is staying well hydrated. Her weight is stable.   ECOG Performance Status: 1 - Symptomatic but completely ambulatory  Medications:  Allergies as of 08/29/2020   No Known Allergies     Medication List       Accurate as of August 29, 2020  8:46 AM. If you have any questions, ask your nurse or doctor.        Bariatric Multivitamins/Iron Caps Take 1 tablet by mouth daily.   cyanocobalamin 1000 MCG/ML injection Commonly known as: (VITAMIN B-12) Inject 1 mL (1,000 mcg total) into the muscle every 30 (thirty) days.   sertraline 100 MG tablet Commonly known as: ZOLOFT Take 1 tablet (100 mg total) by mouth daily.   sertraline 25 MG tablet Commonly known as: ZOLOFT Take 1 tablet (25 mg total) by mouth daily.       Allergies: No Known Allergies  Past Medical History, Surgical history, Social history, and Family History were reviewed and updated.  Review of Systems: All other 10 point review of systems is negative.   Physical Exam:  height is 5\' 9"  (1.753  m) and weight is 198 lb 1.3 oz (89.8 kg). Her oral temperature is 97.7 F (36.5 C). Her blood pressure is 116/68 and her pulse is 79. Her respiration is 17 and oxygen saturation is 96%.   Wt Readings from Last 3 Encounters:  08/29/20 198 lb 1.3 oz (89.8 kg)  08/07/20 198 lb 8 oz (90 kg)  07/04/20 195 lb 6 oz (88.6 kg)    Ocular: Sclerae unicteric, pupils equal, round and reactive to light Ear-nose-throat: Oropharynx clear, dentition fair Lymphatic: No cervical or supraclavicular adenopathy Lungs no rales or rhonchi, good excursion bilaterally Heart regular rate and rhythm, no murmur appreciated Abd soft, nontender, positive bowel sounds MSK no focal spinal tenderness, no joint edema Neuro: non-focal, well-oriented, appropriate affect Breasts: Deferred   Lab Results  Component Value Date   WBC 7.8 08/29/2020   HGB 13.7 08/29/2020   HCT 43.4 08/29/2020   MCV 84.4 08/29/2020   PLT 296 08/29/2020   Lab Results  Component Value Date   FERRITIN 15 04/29/2020   IRON 75 04/29/2020   TIBC 375 04/29/2020   UIBC 300 04/29/2020   IRONPCTSAT 20 (L) 04/29/2020   Lab Results  Component Value Date   RETICCTPCT 1.4 08/29/2020   RBC 5.12 (H) 08/29/2020   RBC 5.14 (H) 08/29/2020   No results found for: KPAFRELGTCHN, LAMBDASER, KAPLAMBRATIO No results found for: IGGSERUM, IGA, IGMSERUM No results found for: TOTALPROTELP, ALBUMINELP, A1GS, A2GS, BETS, BETA2SER,  [place], SPEI   Chemistry      Component Value Date/Time   NA 134 (L) 08/07/2020 1003   K 4.2 08/07/2020 1003   CL 102 08/07/2020 1003   CO2 26 08/07/2020 1003   BUN 11 08/07/2020 1003   CREATININE 0.63 08/07/2020 1003   CREATININE 0.75 08/06/2019 1452      Component Value Date/Time   CALCIUM 8.9 08/07/2020 1003   ALKPHOS 89 08/07/2020 1003   AST 15 08/07/2020 1003   AST 16 08/06/2019 1452   ALT 11 08/07/2020 1003   ALT 13 08/06/2019 1452   BILITOT 0.4 08/07/2020 1003   BILITOT 0.3 08/06/2019 1452        Impression and Plan: Madeline Cannon is a very pleasant 41 yocaucasian female with 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 Iron studies are pending. We will replace if needed.  B 12 has remained chronically low. We will see what today's level is and adjust her injections if needed.  Follow-up in 4 months.  She can contact our office with any questions or concerns.   Emeline Gins, NP 1/7/20228:46 AM

## 2020-08-29 NOTE — Telephone Encounter (Signed)
Appointments scheduled patient get updates from My Chart per 1/7 los

## 2020-09-01 ENCOUNTER — Encounter: Payer: Self-pay | Admitting: *Deleted

## 2020-09-01 ENCOUNTER — Telehealth: Payer: Self-pay | Admitting: Family

## 2020-09-01 NOTE — Telephone Encounter (Signed)
Called and advised patient of appointments added per 1/7 sch msg

## 2020-09-02 ENCOUNTER — Other Ambulatory Visit: Payer: Self-pay | Admitting: Family

## 2020-09-03 ENCOUNTER — Other Ambulatory Visit: Payer: Self-pay | Admitting: *Deleted

## 2020-09-03 DIAGNOSIS — D518 Other vitamin B12 deficiency anemias: Secondary | ICD-10-CM

## 2020-09-03 MED ORDER — CYANOCOBALAMIN 1000 MCG/ML IJ SOLN: 1000.0000 ug | INTRAMUSCULAR | 11 refills | Status: DC

## 2020-09-05 ENCOUNTER — Other Ambulatory Visit: Payer: Self-pay

## 2020-09-05 ENCOUNTER — Inpatient Hospital Stay: Payer: Managed Care, Other (non HMO)

## 2020-09-05 ENCOUNTER — Ambulatory Visit: Payer: Managed Care, Other (non HMO)

## 2020-09-05 VITALS — BP 108/68 | HR 78 | Temp 98.2°F | Resp 20

## 2020-09-05 DIAGNOSIS — D508 Other iron deficiency anemias: Secondary | ICD-10-CM

## 2020-09-05 MED ORDER — SODIUM CHLORIDE 0.9 % IV SOLN
Freq: Once | INTRAVENOUS | Status: AC
Start: 2020-09-05 — End: 2020-09-05
  Filled 2020-09-05: qty 250

## 2020-09-05 MED ORDER — SODIUM CHLORIDE 0.9 % IV SOLN
200.0000 mg | Freq: Once | INTRAVENOUS | Status: AC
  Administered 2020-09-05: 200 mg via INTRAVENOUS
  Filled 2020-09-05: qty 200

## 2020-09-05 NOTE — Patient Instructions (Signed)

## 2020-09-12 ENCOUNTER — Other Ambulatory Visit: Payer: Self-pay

## 2020-09-12 ENCOUNTER — Inpatient Hospital Stay: Payer: Managed Care, Other (non HMO)

## 2020-09-12 ENCOUNTER — Ambulatory Visit: Payer: Managed Care, Other (non HMO)

## 2020-09-12 VITALS — BP 114/75 | HR 68 | Temp 97.9°F | Resp 17

## 2020-09-12 DIAGNOSIS — D508 Other iron deficiency anemias: Secondary | ICD-10-CM

## 2020-09-12 MED ORDER — SODIUM CHLORIDE 0.9 % IV SOLN
Freq: Once | INTRAVENOUS | Status: AC
  Filled 2020-09-12: qty 250

## 2020-09-12 MED ORDER — SODIUM CHLORIDE 0.9 % IV SOLN
200.0000 mg | Freq: Once | INTRAVENOUS | Status: AC
  Administered 2020-09-12: 200 mg via INTRAVENOUS
  Filled 2020-09-12: qty 200

## 2020-09-12 NOTE — Patient Instructions (Signed)

## 2020-09-15 ENCOUNTER — Other Ambulatory Visit: Payer: Self-pay

## 2020-09-15 ENCOUNTER — Ambulatory Visit: Payer: Managed Care, Other (non HMO)

## 2020-09-15 ENCOUNTER — Inpatient Hospital Stay: Payer: Managed Care, Other (non HMO)

## 2020-09-15 VITALS — BP 118/74 | HR 74 | Temp 98.0°F | Resp 18

## 2020-09-15 DIAGNOSIS — D508 Other iron deficiency anemias: Secondary | ICD-10-CM

## 2020-09-15 MED ORDER — SODIUM CHLORIDE 0.9 % IV SOLN
200.0000 mg | Freq: Once | INTRAVENOUS | Status: AC
  Administered 2020-09-15: 200 mg via INTRAVENOUS
  Filled 2020-09-15: qty 200

## 2020-09-15 MED ORDER — SODIUM CHLORIDE 0.9 % IV SOLN
Freq: Once | INTRAVENOUS | Status: AC
  Filled 2020-09-15: qty 250

## 2020-09-15 NOTE — Patient Instructions (Signed)

## 2020-09-16 ENCOUNTER — Other Ambulatory Visit: Payer: Self-pay | Admitting: Family

## 2020-09-16 DIAGNOSIS — D518 Other vitamin B12 deficiency anemias: Secondary | ICD-10-CM

## 2020-09-16 MED ORDER — CYANOCOBALAMIN 1000 MCG/ML IJ SOLN: 1000.0000 ug | Freq: Once | INTRAMUSCULAR | 0 refills | Status: AC

## 2020-09-17 ENCOUNTER — Other Ambulatory Visit: Payer: Self-pay

## 2020-09-17 ENCOUNTER — Inpatient Hospital Stay: Payer: Managed Care, Other (non HMO)

## 2020-09-17 ENCOUNTER — Ambulatory Visit: Payer: Managed Care, Other (non HMO)

## 2020-09-17 VITALS — BP 111/73 | HR 81 | Temp 98.4°F | Resp 17

## 2020-09-17 DIAGNOSIS — D508 Other iron deficiency anemias: Secondary | ICD-10-CM | POA: Diagnosis not present

## 2020-09-17 MED ORDER — SODIUM CHLORIDE 0.9 % IV SOLN
200.0000 mg | Freq: Once | INTRAVENOUS | Status: AC
  Administered 2020-09-17: 200 mg via INTRAVENOUS
  Filled 2020-09-17: qty 200

## 2020-09-17 MED ORDER — SODIUM CHLORIDE 0.9 % IV SOLN
Freq: Once | INTRAVENOUS | Status: AC
  Filled 2020-09-17: qty 250

## 2020-09-17 NOTE — Patient Instructions (Signed)

## 2020-09-19 ENCOUNTER — Other Ambulatory Visit: Payer: Self-pay

## 2020-09-19 ENCOUNTER — Ambulatory Visit: Payer: Managed Care, Other (non HMO)

## 2020-09-19 ENCOUNTER — Inpatient Hospital Stay: Payer: Managed Care, Other (non HMO)

## 2020-09-19 VITALS — BP 120/70 | HR 74 | Temp 98.1°F

## 2020-09-19 DIAGNOSIS — D508 Other iron deficiency anemias: Secondary | ICD-10-CM

## 2020-09-19 MED ORDER — SODIUM CHLORIDE 0.9 % IV SOLN
200.0000 mg | Freq: Once | INTRAVENOUS | Status: AC
  Administered 2020-09-19: 200 mg via INTRAVENOUS
  Filled 2020-09-19: qty 200

## 2020-09-19 MED ORDER — SODIUM CHLORIDE 0.9 % IV SOLN
Freq: Once | INTRAVENOUS | Status: AC
  Filled 2020-09-19: qty 250

## 2020-09-19 NOTE — Patient Instructions (Signed)

## 2020-09-22 ENCOUNTER — Ambulatory Visit (INDEPENDENT_AMBULATORY_CARE_PROVIDER_SITE_OTHER): Payer: Managed Care, Other (non HMO) | Admitting: Obstetrics & Gynecology

## 2020-09-22 ENCOUNTER — Encounter: Payer: Self-pay | Admitting: Obstetrics & Gynecology

## 2020-09-22 ENCOUNTER — Other Ambulatory Visit: Payer: Self-pay

## 2020-09-22 VITALS — BP 117/81 | HR 75 | Ht 70.0 in | Wt 203.0 lb

## 2020-09-22 DIAGNOSIS — N939 Abnormal uterine and vaginal bleeding, unspecified: Secondary | ICD-10-CM

## 2020-09-22 DIAGNOSIS — E282 Polycystic ovarian syndrome: Secondary | ICD-10-CM | POA: Diagnosis not present

## 2020-09-22 MED ORDER — SLYND 4 MG PO TABS: 1.0000 | ORAL_TABLET | Freq: Every day | ORAL | 0 refills | Status: AC

## 2020-09-22 NOTE — Progress Notes (Signed)
GYNECOLOGY OFFICE VISIT NOTE  History:   Madeline Cannon is a 45 y.o. G1P0010 here today for management of PCOS related AUB and abnormal hair growth.  Referred by Dr. Gweneth Dimitri.  Has used OCPs in the past, but had mood swings.  She denies any current abnormal vaginal discharge, pelvic pain or other concerns.    Past Medical History:  Diagnosis Date  . PCOS (polycystic ovarian syndrome)   . Vaginal Pap smear, abnormal     Past Surgical History:  Procedure Laterality Date  . CHOLECYSTECTOMY  2000  . COLONOSCOPY WITH PROPOFOL N/A 09/06/2019   Procedure: COLONOSCOPY WITH PROPOFOL;  Surgeon: Toney Reil, MD;  Location: [facility] ENDOSCOPY;  Service: Endoscopy;  Laterality: N/A;  . ESOPHAGOGASTRODUODENOSCOPY (EGD) WITH PROPOFOL N/A 09/06/2019   Procedure: ESOPHAGOGASTRODUODENOSCOPY (EGD) WITH PROPOFOL;  Surgeon: Toney Reil, MD;  Location: [facility] ENDOSCOPY;  Service: Endoscopy;  Laterality: N/A;  . GASTRIC BYPASS  2014    The following portions of the patient's history were reviewed and updated as appropriate: allergies, current medications, past family history, past medical history, past social history, past surgical history and problem list.   Health Maintenance:  Normal pap and negative HRHPV on 08/07/2020.    Review of Systems:  Pertinent items noted in HPI and remainder of comprehensive ROS otherwise negative.  Physical Exam:  BP 117/81   Cannon 77   Ht 5\' 10"  (1.778 m)   Wt 203 lb (92.1 kg)   LMP 09/15/2020 (Exact Date)   BMI 29.13 kg/m  CONSTITUTIONAL: Well-developed, well-nourished female in no acute distress.  HEENT:  Normocephalic, atraumatic. External right and left ear normal. No scleral icterus.  NECK: Normal range of motion, supple, no masses noted on observation SKIN: No rash noted. Not diaphoretic. No erythema. No pallor. MUSCULOSKELETAL: Normal range of motion. No edema noted. NEUROLOGIC: Alert and oriented to person, place, and time. Normal muscle tone  coordination. No cranial nerve deficit noted. PSYCHIATRIC: Normal mood and affect. Normal behavior. Normal judgment and thought content. CARDIOVASCULAR: Normal heart rate noted RESPIRATORY: Effort and breath sounds normal, no problems with respiration noted ABDOMEN: No masses noted. No other overt distention noted.   PELVIC: Deferred     Assessment and Plan:      1. PCOS (polycystic ovarian syndrome) 2. Abnormal uterine bleeding (AUB) Discussed management modalities for PCOS.  Will defer management of insulin resistance to Dr. 09/17/2020 (Metformin (1500 mg -2000 mg daily) is usual treatment, but concerned about absorption status post gastric bypass). Discussed hormonal management, recommended progestin only modalities including Slynd or IUD.  Slynd (Drosperinone) is an anti-androgenic progestin, can also help with abnormal hair growth. Spironolactone can also be used to treat the abnormal hair growth.  Discussed R/B/I/A, all questions answered.  She wants to try [facility] for now.  Ultrasound also ordered to rule out any structural anomalies as the etiology for AUB. - [facility] PELVIC COMPLETE WITH TRANSVAGINAL; Future - Drospirenone (SLYND) 4 MG TABS; Take 1 tablet by mouth daily.  Dispense: 56 tablet; Refill: 0  Routine preventative health maintenance measures emphasized. Please refer to After Visit Summary for other counseling recommendations.   Return in about 2 months (around 11/20/2020) for Followup for PCOS and AUB.    I spent 20 minutes dedicated to the care of this patient including pre-visit review of records, face to face time with the patient discussing her conditions and treatments and post visit ordering of testing.    11/22/2020, MD, FACOG Obstetrician & Gynecologist, Faculty  [facility] for [employer], [facility]

## 2020-09-22 NOTE — Patient Instructions (Signed)
Discuss Metformin with Dr. Selena Batten (concerned about absorption with history of gastric bypass)  Research Spironolactone for abnormal hair growth

## 2020-09-25 ENCOUNTER — Encounter: Payer: Self-pay | Admitting: Family Medicine

## 2020-09-25 DIAGNOSIS — N760 Acute vaginitis: Secondary | ICD-10-CM

## 2020-09-25 MED ORDER — FLUCONAZOLE 150 MG PO TABS: 150.0000 mg | ORAL_TABLET | Freq: Once | ORAL | 0 refills | Status: AC

## 2020-09-29 ENCOUNTER — Ambulatory Visit (HOSPITAL_COMMUNITY): Payer: Managed Care, Other (non HMO)

## 2020-10-14 ENCOUNTER — Encounter: Payer: Self-pay | Admitting: Radiology

## 2020-10-14 ENCOUNTER — Other Ambulatory Visit: Payer: Self-pay | Admitting: Family

## 2020-10-14 DIAGNOSIS — D518 Other vitamin B12 deficiency anemias: Secondary | ICD-10-CM

## 2020-12-26 ENCOUNTER — Inpatient Hospital Stay: Payer: Managed Care, Other (non HMO) | Attending: Family Medicine

## 2020-12-26 ENCOUNTER — Inpatient Hospital Stay: Payer: Managed Care, Other (non HMO) | Admitting: Family

## 2021-02-09 IMAGING — DX RIGHT HAND - COMPLETE 3+ VIEW
3 series · 3 of 3 positions shown · non-contrast
Comparison: None.

CLINICAL DATA: 42-year-old female with 2nd digit pain and numbness
in the 3rd through 5th digits.

EXAM:
RIGHT HAND - COMPLETE 3+ VIEW

[hand ap]
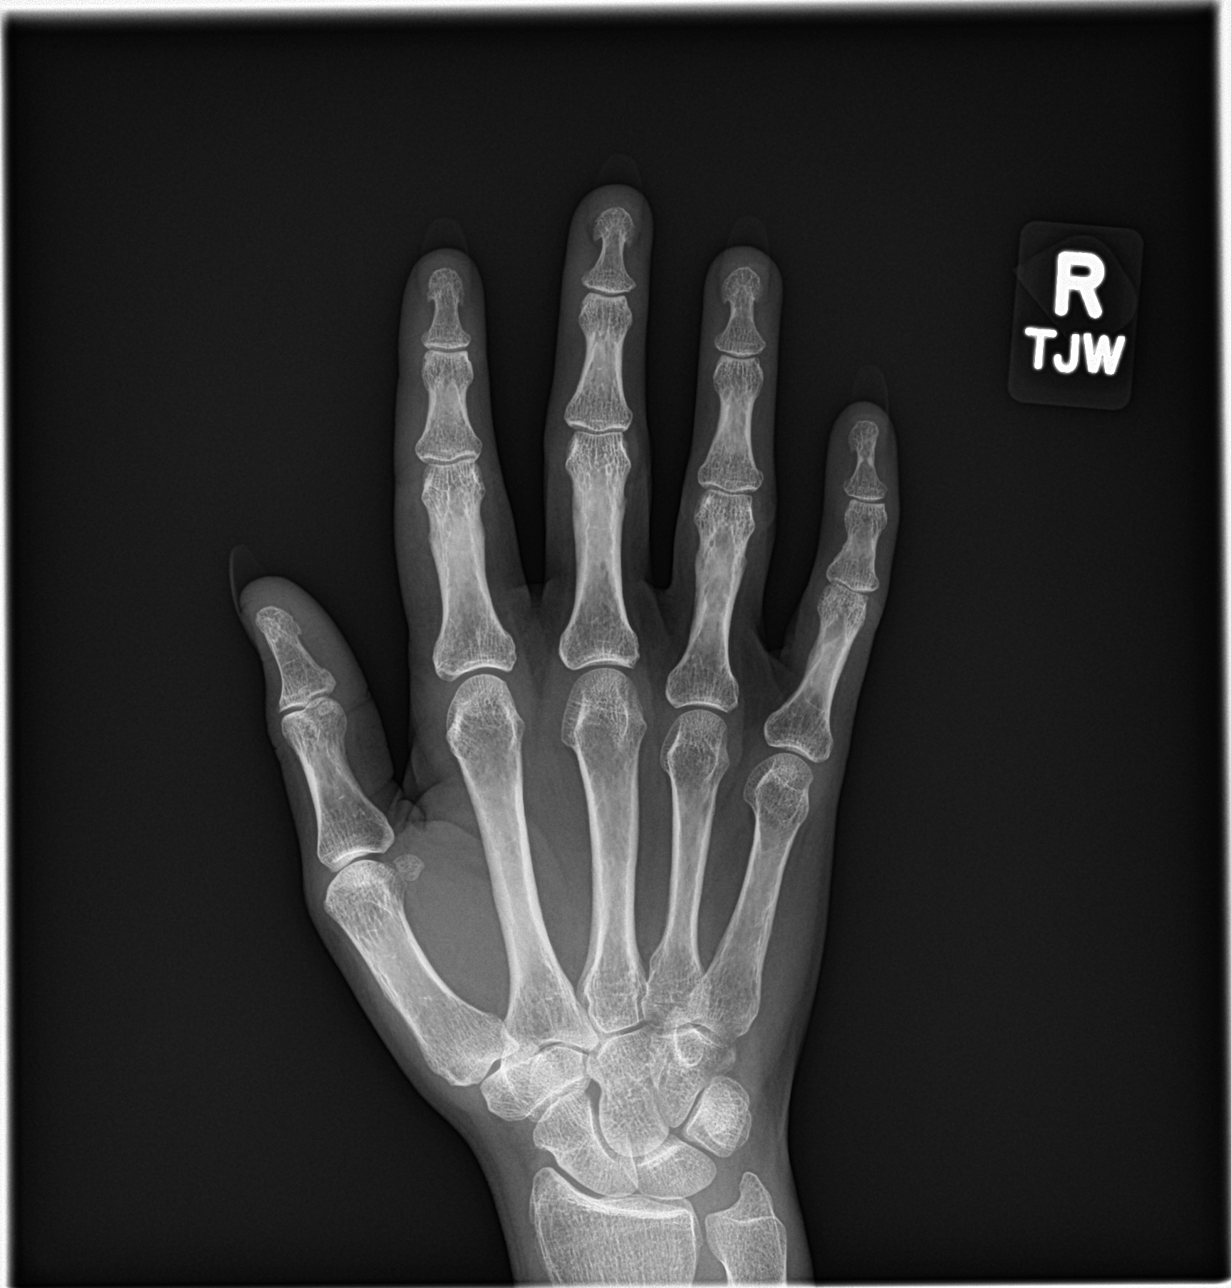

[hand obl]
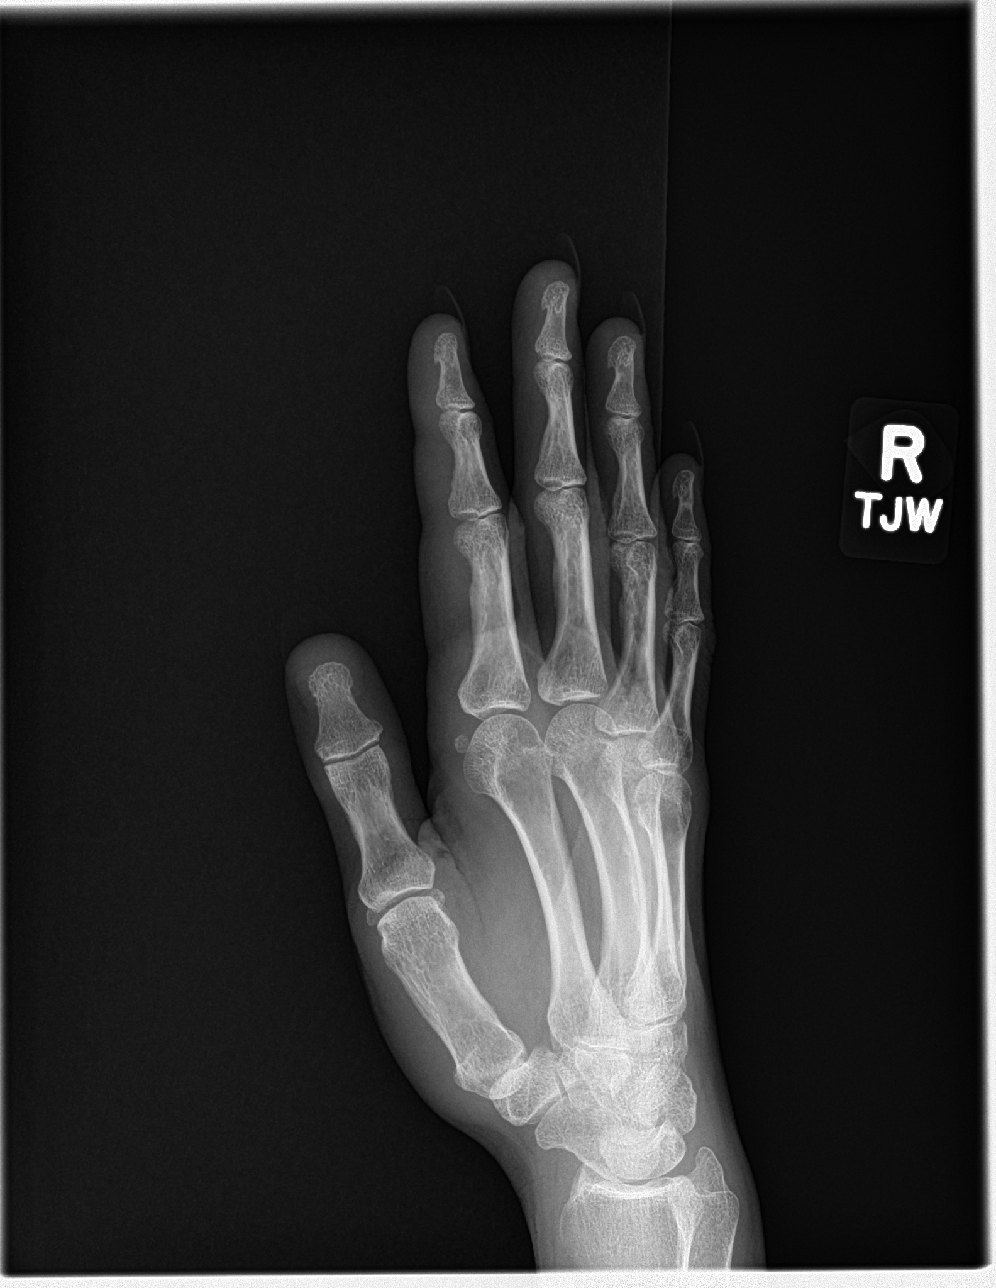

[hand lat]
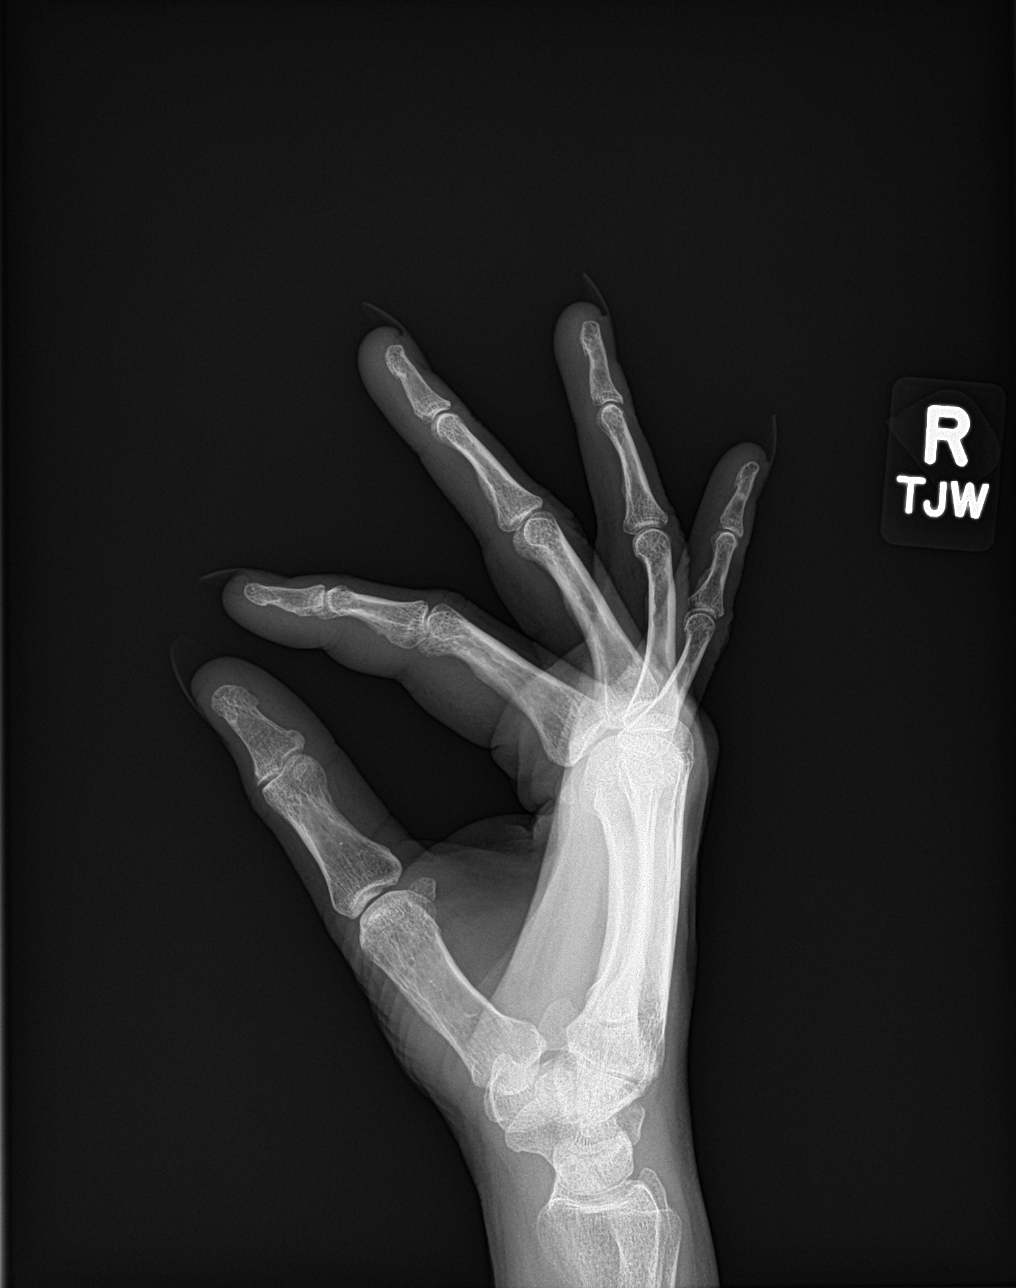

[3 of 3 positions shown; findings below may reference images not displayed]

FINDINGS: Bone mineralization is within normal limits. There is no evidence of
fracture or dislocation. There is no evidence of arthropathy or
other focal bone abnormality. Soft tissues are unremarkable.
IMPRESSION: Negative.

## 2022-11-05 ENCOUNTER — Encounter (INDEPENDENT_AMBULATORY_CARE_PROVIDER_SITE_OTHER): Payer: Self-pay | Admitting: Student in an Organized Health Care Education/Training Program

## 2022-11-11 ENCOUNTER — Encounter (INDEPENDENT_AMBULATORY_CARE_PROVIDER_SITE_OTHER): Payer: Self-pay | Admitting: Student in an Organized Health Care Education/Training Program

## 2022-11-11 ENCOUNTER — Other Ambulatory Visit: Payer: Self-pay

## 2022-11-11 ENCOUNTER — Ambulatory Visit
Payer: 59 | Attending: Student in an Organized Health Care Education/Training Program | Admitting: Student in an Organized Health Care Education/Training Program

## 2022-11-11 VITALS — BP 124/84 | HR 68 | Temp 97.9°F | Ht 70.0 in | Wt 214.5 lb

## 2022-11-11 DIAGNOSIS — L409 Psoriasis, unspecified: Secondary | ICD-10-CM | POA: Insufficient documentation

## 2022-11-11 DIAGNOSIS — E669 Obesity, unspecified: Secondary | ICD-10-CM | POA: Insufficient documentation

## 2022-11-11 DIAGNOSIS — L4050 Arthropathic psoriasis, unspecified: Secondary | ICD-10-CM | POA: Insufficient documentation

## 2022-11-11 DIAGNOSIS — Z79899 Other long term (current) drug therapy: Secondary | ICD-10-CM | POA: Insufficient documentation

## 2022-11-11 DIAGNOSIS — Z6831 Body mass index (BMI) 31.0-31.9, adult: Secondary | ICD-10-CM | POA: Insufficient documentation

## 2022-11-11 NOTE — Progress Notes (Signed)
[facility]  [place] [address]    HPI:  This is a case of a 47 y.o. year old female who comes in today for evaluation of psoriatic arthritis.    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 Hx of dactylitis involving the fingers. Stiffness lasting all day prior to starting Tremfya. Symptoms initially came on around 2 years. Her skin is around 90% improved. No uveitis symptoms. AM stiffness less than 30 minutes currently.       Denies having any shortness of breath, chest pain, abdominal pain, fevers, unintential weight loss.    All systems reviewed and are otherwise unremarkable unless stated above.    Social Hx- no alcohol use, 1/2 pack smoking (habit) , resides in [place], [place]  Family Hx- no family hx of PsO    Current Outpatient Medications   Medication Sig Dispense Refill    ixekizumab (TALTZ AUTOINJECTOR) 80 mg/mL Subcutaneous Auto-Injector Inject 1 mL (80 mg total) under the skin Every 28 days      sertraline (ZOLOFT) 100 mg Oral Tablet Take 1 Tablet (100 mg total) by mouth Once a day       No current facility-administered medications for this visit.     No Known Allergies      Past Surgical History:   Procedure Laterality Date    GALLBLADDER SURGERY  2000    gallstones    HX GASTRIC BYPASS  2014    weight loss       PHYSICAL EXAM:  VITALS:   BP 124/84   Pulse 68   Temp 36.6 C (97.9 F) (Thermal Scan)   Ht 1.778 m (5\' 10" )   Wt 97.3 kg (214 lb 8.1 oz)   LMP 11/11/2022   SpO2 98%   BMI 30.78 kg/m       General- well appearing woman resting comfortably in chair  Neurologic- no focal deficits noted, speaks in full sentences and answers questions appropriately   HEENT- non  traumatic skull, no scleral icterus, normal appearing oral mucosa,   Lungs- normal work of breathing, CTA bilaterally, no wheezes, no rhonchi  Heart- regular rate, regular rhythm, normal S1 and S2, no murmur  Abdomen- soft, non tender, non distended,   Skin- dry erythematous skin noted over palms of hands bilaterally, healing skin noted over soles of feet bilaterally, pitting of finger nails bialterally  Musculoskeletal- no synovitis, no dactylitis, 5/5 muscle strength in upper and lower extremities  Psych- appropriate mood and corresponding affect     Labs/imaging:  No labs available     Assessment and Plan:  47 y.o. year old female presents today to establish care management of psoriatic arthritis.  Patient is currently on Taltz as prescribed by Dermatology which is an excellent medicine for psoriasis and psoriatic arthritis.  She had no evidence of psoriatic arthritis begin active on examination today.  I would recommend continuation of Taltz.  Patient encouraged to reach out if new joint swelling or stiffness were to develop.  Plan follow-up in 6 months or sooner if needed.    1. PSA (psoriatic arthritis) (CMS HCC)  -  in remission while on Taltz (patient just started maintenance therapy last week)    2. Psoriasis  - improving  - also has fingernail involvement        Return in about 6 months (around 05/14/2023) for In Person Visit.    Jerelyn Charles, MD

## 2023-01-20 ENCOUNTER — Other Ambulatory Visit: Payer: Self-pay

## 2023-01-20 ENCOUNTER — Other Ambulatory Visit (INDEPENDENT_AMBULATORY_CARE_PROVIDER_SITE_OTHER): Payer: Self-pay | Admitting: Student in an Organized Health Care Education/Training Program

## 2023-01-20 DIAGNOSIS — L409 Psoriasis, unspecified: Secondary | ICD-10-CM

## 2023-01-20 DIAGNOSIS — L4050 Arthropathic psoriasis, unspecified: Secondary | ICD-10-CM

## 2023-01-20 DIAGNOSIS — Z79899 Other long term (current) drug therapy: Secondary | ICD-10-CM

## 2023-01-20 MED ORDER — TALTZ AUTOINJECTOR 80 MG/ML SUBCUTANEOUS
80.0000 mg | AUTO-INJECTOR | SUBCUTANEOUS | 1 refills | Status: DC
Start: 2023-01-20 — End: 2023-06-28

## 2023-01-20 NOTE — Telephone Encounter (Signed)
Spoke with patient on the phone.  She states that it was prescribed by her rheumatologist in [place] but she has moved here now.      Shelby Client Salvatrice Morandi, LPN  2/95/6213,  11:58

## 2023-01-20 NOTE — Telephone Encounter (Signed)
Spoke with patient on the phone.  She was notified that she is needing to have updated blood work done and that script has been sent for PA.  Lab orders were faxed to [facility] per patient request.    Staci Acosta, LPN  1/61/0960,  14:57

## 2023-01-20 NOTE — Telephone Encounter (Signed)
Elana Clites was seen on 11/11/2022 by Dr. Lynnea Maizes for PSA.  Don't find any lab results .  The next scheduled appointment date is 05/17/2023.  Patient is requesting a refill on Taltz to [facility] but states that it needs a new prior authorization.  Message has been routed to Dr. Lynnea Maizes      No results found for: "WBC", "WBCJ", "HGB", "HCT", "PLTCNT", "SEDRATE", "ESR", "RBC", "MCV", "MCHC", "MCH", "RDW", "MPV"   No results found for: "CREATININE"   Hepatic Function  No results found for: "ALBUMIN", "TOTALPROTEIN", "ALKPHOS", "GAMMAGT", "PROTHROMTME", "INR", "AST", "ALT", "BILIRUBINCON"      Staci Acosta, LPN  10/28/6576,  10:30

## 2023-01-21 ENCOUNTER — Other Ambulatory Visit: Payer: Self-pay

## 2023-01-21 NOTE — Telephone Encounter (Signed)
Prior authorization for Taltz submitted electronically on 01/21/2023 through CoverMyMeds. Key [id number]. Waiting for response from payor.    Elisabeth Pigeon, Pharmacy Technician  01/21/23  10:44 am.

## 2023-01-24 ENCOUNTER — Other Ambulatory Visit: Payer: Self-pay

## 2023-01-24 NOTE — Telephone Encounter (Addendum)
Specialty Pharmacy Note    Prior Authorization for medication Altamease Oiler has been approved by payor [employer] from 12/22/22 until 07/20/23.  Approval notice has not been received but if needed can reach out to obtain. Per the patient's request or insurance requirement the prescription will be sent to [facility].     No further action is needed from clinic    If you have any questions, don't hesitate to contact the Specialty Pharmacy     Elisabeth Pigeon, Pharmacy Technician  01/24/23  11:35 am.

## 2023-02-15 ENCOUNTER — Emergency Department (HOSPITAL_COMMUNITY): Payer: 59

## 2023-02-15 ENCOUNTER — Other Ambulatory Visit: Payer: Self-pay

## 2023-02-15 ENCOUNTER — Emergency Department
Admission: EM | Admit: 2023-02-15 | Discharge: 2023-02-15 | Disposition: A | Discharge status: Home / Self Care | Payer: 59

## 2023-02-15 DIAGNOSIS — R1010 Upper abdominal pain, unspecified: Secondary | ICD-10-CM | POA: Insufficient documentation

## 2023-02-15 DIAGNOSIS — K5730 Diverticulosis of large intestine without perforation or abscess without bleeding: Secondary | ICD-10-CM | POA: Insufficient documentation

## 2023-02-15 DIAGNOSIS — R1013 Epigastric pain: Secondary | ICD-10-CM

## 2023-02-15 DIAGNOSIS — N3289 Other specified disorders of bladder: Secondary | ICD-10-CM | POA: Insufficient documentation

## 2023-02-15 DIAGNOSIS — Z3200 Encounter for pregnancy test, result unknown: Secondary | ICD-10-CM | POA: Insufficient documentation

## 2023-02-15 DIAGNOSIS — K760 Fatty (change of) liver, not elsewhere classified: Secondary | ICD-10-CM | POA: Insufficient documentation

## 2023-02-15 LAB — HEPATIC FUNCTION PANEL
ALBUMIN: 3.9 g/dL (ref 3.5–5.0)
ALKALINE PHOSPHATASE: 98 U/L (ref 40–110)
ALT (SGPT): 10 U/L (ref 8–22)
AST (SGOT): 12 U/L (ref 8–45)
BILIRUBIN DIRECT: 0.1 mg/dL (ref 0.1–0.4)
BILIRUBIN TOTAL: 0.4 mg/dL (ref 0.3–1.3)
PROTEIN TOTAL: 8 g/dL (ref 6.4–8.3)

## 2023-02-15 LAB — BASIC METABOLIC PANEL
ANION GAP: 13 mmol/L (ref 4–13)
BUN/CREA RATIO: 19 (ref 6–22)
BUN: 14 mg/dL (ref 8–25)
CALCIUM: 9.8 mg/dL (ref 8.6–10.2)
CHLORIDE: 103 mmol/L (ref 96–111)
CO2 TOTAL: 21 mmol/L — ABNORMAL LOW (ref 22–30)
CREATININE: 0.72 mg/dL (ref 0.60–1.05)
ESTIMATED GFR - FEMALE: >90 mL/min/BSA (ref >=60)
GLUCOSE: 90 mg/dL (ref 65–125)
POTASSIUM: 4 mmol/L (ref 3.5–5.1)
SODIUM: 137 mmol/L (ref 136–145)

## 2023-02-15 LAB — CBC WITH DIFF
BASOPHIL #: <0.1 10*3/uL (ref <=0.20)
BASOPHIL %: 1 %
EOSINOPHIL #: 0.2 10*3/uL (ref <=0.50)
EOSINOPHIL %: 2 %
HCT: 42.9 % (ref 34.8–46.0)
HGB: 12.9 g/dL (ref 11.5–16.0)
IMMATURE GRANULOCYTE #: <0.1 10*3/uL (ref <0.10)
IMMATURE GRANULOCYTE %: 0 % (ref 0.0–1.0)
LYMPHOCYTE #: 2.18 10*3/uL (ref 1.00–4.80)
LYMPHOCYTE %: 22 %
MCH: 23.2 pg — ABNORMAL LOW (ref 26.0–32.0)
MCHC: 30.1 g/dL — ABNORMAL LOW (ref 31.0–35.5)
MCV: 77.2 fL — ABNORMAL LOW (ref 78.0–100.0)
MONOCYTE #: 0.66 10*3/uL (ref 0.20–1.10)
MONOCYTE %: 7 %
MPV: 9.9 fL (ref 8.7–12.5)
NEUTROPHIL #: 6.7 10*3/uL (ref 1.50–7.70)
NEUTROPHIL %: 68 %
PLATELETS: 333 10*3/uL (ref 150–400)
RBC: 5.56 10*6/uL — ABNORMAL HIGH (ref 3.85–5.22)
RDW-CV: 18.8 % — ABNORMAL HIGH (ref 11.5–15.5)
WBC: 9.9 10*3/uL (ref 3.7–11.0)

## 2023-02-15 LAB — URINALYSIS, MACRO/MICRO
BILIRUBIN: NEGATIVE mg/dL
BLOOD: 0.06 mg/dL — AB
COLOR: NORMAL
GLUCOSE: NEGATIVE mg/dL
KETONES: NEGATIVE mg/dL
LEUKOCYTES: NEGATIVE WBCs/uL
NITRITE: NEGATIVE
PH: 5.5 (ref 5.0–8.0)
PROTEIN: NEGATIVE mg/dL
SPECIFIC GRAVITY: 1.015 (ref 1.005–1.030)
UROBILINOGEN: NEGATIVE mg/dL

## 2023-02-15 LAB — LIPASE: LIPASE: 28 U/L (ref 10–60)

## 2023-02-15 LAB — HCG, URINE QUALITATIVE, PREGNANCY: HCG URINE QUALITATIVE: NEGATIVE

## 2023-02-15 LAB — LACTIC ACID LEVEL W/ REFLEX FOR LEVEL >2.0: LACTIC ACID: 1.8 mmol/L (ref 0.5–2.2)

## 2023-02-15 MED ORDER — DIATRIZOATE MEGLUMINE-DIATRIZOATE SODIUM 66 %-10 % ORAL SOLUTION
15.0000 mL | Freq: Once | ORAL | Status: AC | PRN
Start: 2023-02-15 — End: 2023-02-15
  Administered 2023-02-15: 15 mL via ORAL
  Filled 2023-02-15: qty 30

## 2023-02-15 MED ORDER — OMEPRAZOLE 20 MG CAPSULE,DELAYED RELEASE
20.0000 mg | DELAYED_RELEASE_CAPSULE | Freq: Every day | ORAL | 0 refills | Status: DC
Start: 2023-02-15 — End: 2023-10-27

## 2023-02-15 MED ORDER — SUCRALFATE 1 GRAM TABLET
1.0000 g | ORAL_TABLET | Freq: Three times a day (TID) | ORAL | 0 refills | Status: AC
Start: 2023-02-15 — End: 2023-03-01

## 2023-02-15 MED ORDER — ONDANSETRON HCL (PF) 4 MG/2 ML INJECTION SOLUTION
4.0000 mg | INTRAMUSCULAR | Status: DC
Start: 2023-02-15 — End: 2023-02-15
  Administered 2023-02-15: 0 mg via INTRAVENOUS

## 2023-02-15 MED ORDER — SODIUM CHLORIDE 0.9 % IV BOLUS
1000.0000 mL | INJECTION | Status: AC
Start: 2023-02-15 — End: 2023-02-15
  Administered 2023-02-15: 1000 mL via INTRAVENOUS
  Administered 2023-02-15: 0 mL via INTRAVENOUS

## 2023-02-15 MED ORDER — IOPAMIDOL 300 MG IODINE/ML (61 %) INTRAVENOUS SOLUTION
100.0000 mL | INTRAVENOUS | Status: AC
Start: 2023-02-15 — End: 2023-02-15
  Administered 2023-02-15: 100 mL via INTRAVENOUS

## 2023-02-15 NOTE — ED Nurses Note (Signed)
Patient discharged home with family.  AVS reviewed with patient/care giver.  A written copy of the AVS and discharge instructions was given to the patient/care giver.  Questions sufficiently answered as needed.  Patient/care giver encouraged to follow up with PCP as indicated.  In the event of an emergency, patient/care giver instructed to call 911 or go to the nearest emergency room.

## 2023-02-15 NOTE — ED Provider Notes (Signed)
Department of Emergency Medicine  HPI - 02/15/2023    Advanced Practice Provider: Driscilla Grammes, APRN  Attending Physician: Dr. Lorin Picket    Chief Complaint: Abdominal Pain    HPI  Shelby Cisneros is a 47 y.o. female who presents to the ED via POV with c/o abdominal pain in the epigastric region. The patient reports associated nausea. She describes pain as a constant ache. She reports last normal bowel movement was this morning. She informs a history of gastric bypass about ten years ago with no issues and a history of cholecystectomy. The patient denies vomiting, fever, chills, and any other complaints or concerns at this time.     History Limitation: None    Review of Systems  All systems reviewed and are negative unless otherwise specified in HPI.    History:   PMH:  No past medical history on file.    PSH:    Past Surgical History:   Procedure Laterality Date    GALLBLADDER SURGERY  2000    gallstones    HX GASTRIC BYPASS  2014    weight loss     Social Hx:    Social History     Socioeconomic History    Marital status: Single     Spouse name: Not on file    Number of children: Not on file    Years of education: Not on file    Highest education level: Not on file   Occupational History    Not on file   Tobacco Use    Smoking status: Every Day     Types: Cigarettes    Smokeless tobacco: Never   Substance and Sexual Activity    Alcohol use: Never    Drug use: Never    Sexual activity: Not on file   Other Topics Concern    Not on file   Social History Narrative    Not on file     Social Determinants of Health     Financial Resource Strain: Not on file   Transportation Needs: Not on file   Social Connections: Not on file   Intimate Partner Violence: Not on file   Housing Stability: Not on file     Family Hx:   Family History   Problem Relation Age of Onset    Cancer Mother     Diabetes Father     Parkinsons Disease Father      Allergies: No Known Allergies  Medications:   Prior to Admission Medications   Prescriptions  Last Dose Informant Patient Reported? Taking?   ixekizumab (TALTZ AUTOINJECTOR) 80 mg/mL Subcutaneous Auto-Injector   No No   Sig: Inject 1 mL (80 mg total) under the skin Every 28 days   sertraline (ZOLOFT) 100 mg Oral Tablet   Yes No   Sig: Take 1 Tablet (100 mg total) by mouth Once a day      Facility-Administered Medications: None       Above history reviewed with patient, changes are as documented.    Physical Exam  Vitals and nursing note reviewed.   Constitutional:       General: She is not in acute distress.     Appearance: She is not diaphoretic.   HENT:      Head: Normocephalic and atraumatic.      Mouth/Throat:      Mouth: Mucous membranes are moist.   Eyes:      Pupils: Pupils are equal, round, and reactive to light.   Cardiovascular:  Rate and Rhythm: Normal rate and regular rhythm.      Heart sounds: Normal heart sounds. No murmur heard.  Pulmonary:      Effort: Pulmonary effort is normal. No respiratory distress.      Breath sounds: Normal breath sounds. No stridor. No wheezing or rales.   Abdominal:      General: Bowel sounds are normal. There is no distension.      Palpations: Abdomen is soft.      Tenderness: There is no abdominal tenderness. There is no guarding or rebound.   Musculoskeletal:         General: No tenderness or deformity. Normal range of motion.      Cervical back: Normal range of motion and neck supple.   Skin:     General: Skin is warm and dry.      Findings: No erythema or rash.   Neurological:      Mental Status: She is alert and oriented to person, place, and time.      Cranial Nerves: No cranial nerve deficit.   Psychiatric:         Mood and Affect: Mood normal.         Behavior: Behavior normal.         Course  Orders, Abnormal Labs and Imaging Results:  Results up to the Time the Disposition was Entered   URINALYSIS, MACRO/MICRO - Abnormal; Notable for the following components:       Result Value    BLOOD 0.06 (*)     All other components within normal limits   BASIC  METABOLIC PANEL - Abnormal; Notable for the following components:    CO2 TOTAL 21 (*)     All other components within normal limits   CBC WITH DIFF - Abnormal; Notable for the following components:    RBC 5.56 (*)     MCV 77.2 (*)     MCH 23.2 (*)     MCHC 30.1 (*)     RDW-CV 18.8 (*)     All other components within normal limits   LIPASE - Normal   LACTIC ACID LEVEL W/ REFLEX FOR LEVEL >2.0 - Normal   HEPATIC FUNCTION PANEL - Normal   URINE CULTURE,ROUTINE - Normal   URINALYSIS WITH REFLEX MICROSCOPIC AND CULTURE IF POSITIVE    Narrative:     The following orders were created for panel order URINALYSIS WITH REFLEX MICROSCOPIC AND CULTURE IF POSITIVE.  Procedure                               Abnormality         Status                     ---------                               -----------         ------                     URINALYSIS, MACRO/MICRO[[id number]]      Abnormal            Final result                 Please view results for these tests on the individual orders.   HCG, URINE QUALITATIVE, PREGNANCY   CBC/DIFF  Narrative:     The following orders were created for panel order CBC/DIFF.  Procedure                               Abnormality         Status                     ---------                               -----------         ------                     CBC WITH DIFF[[id number]]                Abnormal            Final result                 Please view results for these tests on the individual orders.   CT ABDOMEN PELVIS W IV CONTRAST    Narrative:     Shelby Cisneros    PROCEDURE DESCRIPTION: CT ABDOMEN PELVIS W IV CONTRAST    CLINICAL INDICATION: Upper abdominal pain. Hx of gastric bypass.    COMPARISON: No prior studies were compared.      FINDINGS: No acute finding in the lung bases. Coronary calcifications.    Status post cholecystectomy. Mild fatty infiltration of the liver adjacent to the falciform ligament. Spleen and pancreas are unremarkable.    Adrenals and kidneys are unremarkable. Moderate  urinary bladder distention. Uterus is present. Hygiene product within the vagina. No adnexal mass.    Mild colonic diverticulosis. Appendix is normal. No acute finding in the bowel. Postsurgical changes of gastric bypass. No evidence bowel obstruction.    Aorta is normal caliber. No lymphadenopathy.    Trace anterolisthesis of L4 on L5 with small diffuse disc bulge.     NS bolus infusion 1,000 mL (0 mL Intravenous Stopped 02/15/23 1817)   iopamidol (ISOVUE-300) 61% infusion (100 mL Intravenous Given 02/15/23 1833)   diatrizoate meglumine & sodium oral solution (15 mL Oral Given 02/15/23 1833)     MDM:   ED Course as of 02/17/23 0913   Tue Feb 15, 2023   1847 CT ABDOMEN PELVIS W IV CONTRAST  IMPRESSION:  1. No evidence of acute process in the abdomen and pelvis.  2. Status post gastric bypass and cholecystectomy.     1847 Labs are unremarkable at this time.    1851 I discussed results with patient at this time. No acute findings were seen today to attribute to patient's upper abdominal pain at this time. With pain in upper abdomen, I am going to start treating for possible GERD, gastritis with Carafate and omeprazole. Will place referral for f/u with gastro for further evaluation. Patient is agreeable to this plan at this time. I did provided opportunity for questions. No questions asked at this time. Return precautions given and discussed.     During the patient's stay in the emergency department, the above listed imaging and/or labs were performed to assist with medical decision making and were reviewed by myself as available for review.   Patient rechecked and remained stable throughout the emergency department course.     Results discussed with patient.   All questions/concerns addressed, and patient  agrees with disposition plan.   Advised that patient return to ED immediately for any new or worsening symptoms; otherwise, follow up with PCP.  Referral sent to: GI     Medical Decision Making  See ED  Course    Problems Addressed:  Pain of upper abdomen: acute illness or injury    Amount and/or Complexity of Data Reviewed  Labs: ordered.  Radiology: ordered. Decision-making details documented in ED Course.    Risk  Prescription drug management.      Consults: None  Impression:   Clinical Impression   Pain of upper abdomen (Primary)     Disposition:  Discharged  Discharge: Following the above history, physical exam, and studies, the patient was deemed stable and suitable for discharge.  Patient will be discharged with Prilosec and Carafate. Medication instructions were discussed with the patient, and she was given a chance to ask questions.   It was advised that the patient return to the ED if they develop new or any other concerning symptoms and follow up as directed.   The patient verbalized understanding of all instructions and had no further questions or concerns.    Follow Up:   Lenice Llamas, DO  [address]  [place] [address]  [phone]      As needed, If symptoms worsen    [facility] - Emergency Department  [address]  [place] [address]  [phone]    As needed, If symptoms worsen    Prescriptions:   Discharge Medication List as of 02/15/2023  6:55 PM        START taking these medications    Details   omeprazole (PRILOSEC) 20 mg Oral Capsule, Delayed Release(E.C.) Take 1 Capsule (20 mg total) by mouth Once a day, Disp-30 Capsule, R-0, E-Rx      sucralfate (CARAFATE) 1 gram Oral Tablet Take 1 Tablet (1 g total) by mouth Three times daily before meals for 14 days, Disp-42 Tablet, R-0, E-Rx              Future Appointments   Date Time Provider Department Center   05/11/2023  9:20 AM Feliz Beam, MD [facility]       I am scribing for, and in the presence of Driscilla Grammes, APRN, for services provided on 02/15/2023.  Malon Kindle, SCRIBE  02/15/2023, 16:31    I personally performed the services described in this documentation, as scribed  in my presence,  and it is both accurate  and complete.    Hart Robinsons, APRN  Hart Robinsons, APRN      The co-signing faculty was physically present in the emergency department and available for consultation and did not participate in the care of this patient.

## 2023-02-15 NOTE — Ancillary Notes (Signed)
Patient drank half a cup of oral before scan after finishing we waited 10 minutes before scanning per ER MD.

## 2023-02-18 LAB — URINE CULTURE,ROUTINE: URINE CULTURE: NO GROWTH

## 2023-05-11 ENCOUNTER — Other Ambulatory Visit (HOSPITAL_COMMUNITY): Payer: 59

## 2023-05-11 ENCOUNTER — Encounter (INDEPENDENT_AMBULATORY_CARE_PROVIDER_SITE_OTHER): Payer: Self-pay | Admitting: Student in an Organized Health Care Education/Training Program

## 2023-05-11 ENCOUNTER — Other Ambulatory Visit: Payer: Self-pay

## 2023-05-11 ENCOUNTER — Ambulatory Visit
Payer: 59 | Attending: Student in an Organized Health Care Education/Training Program | Admitting: Student in an Organized Health Care Education/Training Program

## 2023-05-11 VITALS — BP 118/76 | HR 61 | Temp 98.0°F | Ht 70.0 in | Wt 194.7 lb

## 2023-05-11 DIAGNOSIS — Z9884 Bariatric surgery status: Secondary | ICD-10-CM | POA: Insufficient documentation

## 2023-05-11 DIAGNOSIS — Z79899 Other long term (current) drug therapy: Secondary | ICD-10-CM | POA: Insufficient documentation

## 2023-05-11 DIAGNOSIS — L4050 Arthropathic psoriasis, unspecified: Secondary | ICD-10-CM | POA: Insufficient documentation

## 2023-05-11 DIAGNOSIS — L409 Psoriasis, unspecified: Secondary | ICD-10-CM | POA: Insufficient documentation

## 2023-05-11 LAB — IRON TRANSFERRIN AND TIBC
IRON (TRANSFERRIN) SATURATION: 5 % — ABNORMAL LOW (ref 15–50)
IRON: 22 ug/dL — ABNORMAL LOW (ref 45–170)
TOTAL IRON BINDING CAPACITY: 469 ug/dL (ref 252–504)
TRANSFERRIN: 335 mg/dL (ref 180–360)

## 2023-05-11 LAB — FERRITIN: FERRITIN: 11 ng/mL (ref 5–200)

## 2023-05-11 LAB — VITAMIN B12: VITAMIN B 12: 163 pg/mL — ABNORMAL LOW (ref 200–900)

## 2023-05-11 LAB — VITAMIN D 25 TOTAL: VITAMIN D 25, TOTAL: 54.1 ng/mL (ref 20.0–100.0)

## 2023-05-11 NOTE — Progress Notes (Signed)
[facility]  [address]  [place] [facility] [address]    HPI:  This is a case of a 47 y.o. year old female who comes in today for follow-up of psoriatic arthritis.    Medical conditions notable for:  - pustular psoriasis (follows with [facility])  - PsA   - obesity BMI 31       Psoriasis and Psoriatic regimen   - Taltz 80 mg every 28 days (started 2024)  - Simponi helped with the joints but didn't help significantly with the skin  - MTX 15 mg weekly primary failure      Patient is doing very well regarding her joints.  She denies having any stiffness in the morning.  No new joint swelling redness warmth, no enthesitis.  Tolerating Taltz without any issues.  She still have some dry skin over the soles of her feet and palms of her hands.  Mild pustules over hands.  Fingernail changes still present.      Initial HPI listed below  Patient doing well. Hx of dactylitis involving the fingers. Stiffness lasting all day prior to starting Tremfya. Symptoms initially came on around 2 years. Her skin is around 90% improved. No uveitis symptoms. AM stiffness less than 30 minutes currently.       Denies having any shortness of breath, chest pain, abdominal pain, fevers, unintential weight loss.    All systems reviewed and are otherwise unremarkable unless stated above.    Social Hx- no alcohol use, 1/2 pack smoking (habit) , resides in [place], [place]  Family Hx- no family hx of PsO    Current Outpatient Medications   Medication Sig Dispense Refill    ixekizumab (TALTZ AUTOINJECTOR) 80 mg/mL Subcutaneous Auto-Injector Inject 1 mL (80 mg total) under the skin Every 28 days 3 mL 1    omeprazole (PRILOSEC) 20 mg Oral Capsule, Delayed Release(E.C.) Take 1 Capsule (20 mg total) by mouth Once a day (Patient not taking: Reported on 05/11/2023) 30 Capsule 0    sertraline (ZOLOFT) 100 mg Oral Tablet Take 1 Tablet (100 mg total) by mouth Once a day       No current facility-administered medications for  this visit.     No Known Allergies      Past Surgical History:   Procedure Laterality Date    GALLBLADDER SURGERY  2000    gallstones    HX GASTRIC BYPASS  2014    weight loss       PHYSICAL EXAM:  VITALS:   BP 118/76   Pulse 61   Temp 36.7 C (98 F) (Thermal Scan)   Ht 1.778 m (5\' 10" )   Wt 88.3 kg (194 lb 10.7 oz)   SpO2 100%   BMI 27.93 kg/m       General- well appearing woman resting comfortably in chair  Neurologic- no focal deficits noted, speaks in full sentences and answers questions appropriately   HEENT- non traumatic skull, no scleral icterus, normal appearing oral mucosa,   Lungs- normal work of breathing, CTA bilaterally, no wheezes, no rhonchi  Heart- regular rate, regular rhythm, normal S1 and S2, no murmur  Abdomen- soft, non tender, non distended,   Skin- dry erythematous skin noted over palms of hands bilaterally, callus formation over medial feet bilaterally, onychomycosis of fingernails  Musculoskeletal- no synovitis, no dactylitis, no enthesitis, 5/5 muscle strength in upper and lower extremities  Psych- appropriate mood and corresponding affect     Labs/imaging:  Labs  reviewed since last visit from 02/15/2023   WBC 9.9, hemoglobin 12.9, platelet count 333   AST 12, ALT 10, total protein 8, albumin 3.9    Assessment and Plan:  47 y.o. year old female presents today to establish care management of psoriatic arthritis.  Patient was currently being prescribed Taltz for her skin and joint disease.  Patient still has mild skin disease but no active psoriatic arthritis.  Recommend continuation of Taltz.  Will plan to follow up with patient in 1 year or sooner if new symptoms arise.  Patient has history of gastric bypass and has not had any vitamins or minerals checked in several years.  Patient requesting to have some baseline labs completed which would be warranted.    1. PSA (psoriatic arthritis) (CMS HCC)  In remission while on Taltz    2. Psoriasis  - mild disease activity; continue to  follow with [facility]    3. High risk medication use  On Taltz    4. Hx of gastric bypass  - IRON TRANSFERRIN AND TIBC; Future  - FERRITIN; Future  - VITAMIN B12; Future  - VITAMIN D 25 TOTAL; Future  - SELENIUM, SERUM; Future  - COPPER, SERUM; Future  - ZINC, SERUM; Future          Return in about 1 year (around 05/10/2024) for In Person Visit, PsA.    Feliz Beam, MD

## 2023-05-13 LAB — SELENIUM, SERUM: SELENIUM: 159 ug/L (ref 63–160)

## 2023-05-13 LAB — COPPER, SERUM: COPPER: 90 ug/dL (ref 70–175)

## 2023-05-14 ENCOUNTER — Emergency Department
Admission: EM | Admit: 2023-05-14 | Discharge: 2023-05-14 | Disposition: A | Discharge status: Home / Self Care | Payer: 59 | Source: Home / Self Care | Attending: EMERGENCY MEDICINE | Admitting: EMERGENCY MEDICINE

## 2023-05-14 ENCOUNTER — Other Ambulatory Visit: Payer: Self-pay

## 2023-05-14 ENCOUNTER — Encounter (HOSPITAL_COMMUNITY): Payer: Self-pay

## 2023-05-14 DIAGNOSIS — E611 Iron deficiency: Secondary | ICD-10-CM

## 2023-05-14 DIAGNOSIS — Z9884 Bariatric surgery status: Secondary | ICD-10-CM

## 2023-05-14 LAB — BASIC METABOLIC PANEL
ANION GAP: 7 mmol/L (ref 4–13)
BUN/CREA RATIO: 12 (ref 6–22)
BUN: 9 mg/dL (ref 8–25)
CALCIUM: 8.8 mg/dL (ref 8.6–10.2)
CHLORIDE: 109 mmol/L (ref 96–111)
CO2 TOTAL: 23 mmol/L (ref 22–30)
CREATININE: 0.75 mg/dL (ref 0.60–1.05)
ESTIMATED GFR - FEMALE: >90 mL/min/BSA (ref >=60)
GLUCOSE: 92 mg/dL (ref 65–125)
POTASSIUM: 4.4 mmol/L (ref 3.5–5.1)
SODIUM: 139 mmol/L (ref 136–145)

## 2023-05-14 LAB — CBC WITH DIFF
BASOPHIL #: <0.1 10*3/uL (ref <=0.20)
BASOPHIL %: 1 %
EOSINOPHIL #: 0.37 10*3/uL (ref <=0.50)
EOSINOPHIL %: 4.6 %
HCT: 37.7 % (ref 34.8–46.0)
HGB: 11.8 g/dL (ref 11.5–16.0)
IMMATURE GRANULOCYTE #: <0.1 10*3/uL (ref <0.10)
IMMATURE GRANULOCYTE %: 0.2 % (ref 0.0–1.0)
LYMPHOCYTE #: 1.78 10*3/uL (ref 1.00–4.80)
LYMPHOCYTE %: 22.1 %
MCH: 24 pg — ABNORMAL LOW (ref 26.0–32.0)
MCHC: 31.3 g/dL (ref 31.0–35.5)
MCV: 76.8 fL — ABNORMAL LOW (ref 78.0–100.0)
MONOCYTE #: 0.56 10*3/uL (ref 0.20–1.10)
MONOCYTE %: 7 %
MPV: 9.8 fL (ref 8.7–12.5)
NEUTROPHIL #: 5.23 10*3/uL (ref 1.50–7.70)
NEUTROPHIL %: 65.1 %
PLATELETS: 298 10*3/uL (ref 150–400)
RBC: 4.91 10*6/uL (ref 3.85–5.22)
RDW-CV: 20.4 % — ABNORMAL HIGH (ref 11.5–15.5)
WBC: 8 10*3/uL (ref 3.7–11.0)

## 2023-05-14 LAB — ZINC, SERUM: ZINC: 90 ug/dL (ref 60–130)

## 2023-05-14 NOTE — Discharge Instructions (Signed)
You were seen in [facility] ED for history of iron deficiency. Your Hgb levels and other ED lab workup are within normal and largely reassuring. We recommend you take your home iron supplements and continue to follow-up with your PCP regarding iron transfusion scheduling.

## 2023-05-14 NOTE — ED Attending Note (Signed)
I was physically present and directly supervised this patient's care. Patient was seen and examined. The midlevel's/resident's history and exam were reviewed. Key elements in addition to and/or correction of that documentation are as follows:    Triage:  Medication Request (Hx of anemia. States she has iron infusions in the past. Had labs drawn on Wednesday. States she was told iron was low. Has not been taking iron supplements.  Requesting an iron infusion.)    Filed Vitals:    05/14/23 1307 05/14/23 1330   BP: 127/84 113/82   Pulse: 90 79   Resp: 16 13   Temp: 36.7 C (98 F)    SpO2: 99% 93%         Shelby Cisneros is a 47 y.o. female p/w low iron.  Patient has a history of psoriatic arthritis and gastric bypass surgery.  Patient is currently on Taltz for psoriatic arthritis.  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  Patient states that she was previously needed blood transfusions and iron infusions.  Patient states that for the past several weeks she has felt tired and felt fatigued.  Patient states that she has had no chest pain, no shortness of breath, no abdominal pain, no nausea, no vomiting, no blood in his stool, no black stool, no fevers, no chills Per chart review, iron studies on 09/18 showed that iron was low at 22, iron saturation was low at 5%.    Pertinent Exam  Patient is alert and oriented  Heart and lungs clear to auscultation  Abdomen is soft and nontender    ED Course  Labs Ordered/Reviewed   CBC WITH DIFF - Abnormal; Notable for the following components:       Result Value    MCV 76.8 (*)     MCH 24.0 (*)     RDW-CV 20.4 (*)     All other components within normal limits   CBC/DIFF    Narrative:     The following orders were created for panel order CBC/DIFF.  Procedure                               Abnormality         Status                     ---------                               -----------         ------                     CBC WITH  DIFF[[id number]]                Abnormal            Final result                 Please view results for these tests on the individual orders.   BASIC METABOLIC PANEL     No orders to display       Results reviewed  EKG: No results found for this visit on 05/14/23 (from the past 720 hour(s)).  Records Reviewed  Labs and imaging reviewed    Dispo:  Patient is a 47 year old female with a past medical history as stated above who  presents to the emergency department with concerns of low iron.  Patient has been been tired and fatigued.  She has had similar symptoms when she was anemic and he denied transfusions.    Iron studies performed on 09/18 showed that iron and iron saturation was low   CBC today shows that hemoglobin is 11.8  Other lab work unremarkable  Informed patient that hemoglobin is 11.8 and then she does not require an iron transfusion emergently   Patient voiced understanding, patient has been discharged      Patients history, physical exam, labs and imaging studies, reviewed prior to DC.  The patient was deemed suitable for discharge with   Discharge Medication List as of 05/14/2023  2:10 PM      . Discharge and medication instructions were discussed with the patient and questions were addressed. The patient understands that they may return to the ED at any time for new or worsening symptoms, or if they have any other concerns. The patient is to follow up as scheduled or suggested below.  Please refer to discharge instructions for further information.  Disposition: Discharged  Follow up:   Lenice Llamas, DO  [address]  [place] [address]  [phone]    Go to   As needed    Clinical Impression:     Clinical Impression   Iron deficiency (Primary)     Future appointments scheduled in [place]:   Future Appointments   Date Time Provider Department Center   05/10/2024  9:20 AM Feliz Beam, MD [facility]     Ammie Dalton, MD 05/14/2023, 20:51  Attending Physician  Emergency Medicine

## 2023-05-14 NOTE — ED Provider Notes (Signed)
[facility] - Emergency Department  ED Primary Provider Note  History of Present Illness   Chief Complaint   Patient presents with    Medication Request     Hx of anemia. States she has iron infusions in the past. Had labs drawn on Wednesday. States she was told iron was low. Has not been taking iron supplements.  Requesting an iron infusion.     Shelby Cisneros is a 47 y.o. female who had concerns including Medication Request.  Arrival: The patient arrived by Private Vehicle    HPI  Patient presents to the ED via POV with c/o medical problem. Patient reports hx iron deficiency anemia with iron infusions and blood transfusion in the past. She states since moving to the area in January 2024 she has not had a transfusion since. However, reports recently having lab work performed on the 18th of September showing her iron and B12 was low. She was evaluated by her PCP yesterday where she receiving B12 injection, and referred for iron infusion. However, patient states she has been experiencing weakness and fatigue over the past couple days that has been progressively worsening prompting her to the ED for possible iron infusion. Patient states she currently is on her [facility]. Patient confirms palpitations intermittently, lips cracking, and having iron supplements at home. Patient denies hematuria, hematochezia, fever, chills, cough, shortness of breath, as well as any other medical concerns or issues at this time.     History Reviewed This Encounter: Medical History  Surgical History  Family History  Social History    Physical Exam   ED Triage Vitals [05/14/23 1307]   BP (Non-Invasive) 127/84   Heart Rate 90   Respiratory Rate 16   Temperature 36.7 C (98 F)   SpO2 99 %   Weight 88.3 kg (194 lb 10.7 oz)   Height 1.778 m (5\' 10" )     Physical Exam  Constitutional:       Appearance: Normal appearance. She is not toxic-appearing.   HENT:      Head: Atraumatic.      Nose: No congestion.      Mouth/Throat:       Mouth: Mucous membranes are moist.   Eyes:      Conjunctiva/sclera: Conjunctivae normal.   Cardiovascular:      Rate and Rhythm: Normal rate and regular rhythm.      Pulses: Normal pulses.   Pulmonary:      Effort: Pulmonary effort is normal. No respiratory distress.   Abdominal:      General: Abdomen is flat.   Musculoskeletal:         General: No deformity.   Skin:     General: Skin is warm and dry.      Capillary Refill: Capillary refill takes less than 2 seconds.   Neurological:      General: No focal deficit present.      Mental Status: She is alert.   Psychiatric:         Mood and Affect: Mood normal.       Patient Data     Labs Ordered/Reviewed   CBC WITH DIFF - Abnormal; Notable for the following components:       Result Value    MCV 76.8 (*)     MCH 24.0 (*)     RDW-CV 20.4 (*)     All other components within normal limits   BASIC METABOLIC PANEL - Normal   CBC/DIFF    Narrative:  The following orders were created for panel order CBC/DIFF.  Procedure                               Abnormality         Status                     ---------                               -----------         ------                     CBC WITH DIFF[[id number]]                Abnormal            Final result                 Please view results for these tests on the individual orders.     No orders to display     Medical Decision Making    Patient is a 47 year old female with a history of iron-deficiency anemia presenting to [facility] ED concern for low iron levels with no obvious source of bleeding outside of ongoing current menses the volume for which is at her baseline.  Patient is actively followed by her PCP who is arranging appointment at iron infusion center.     Medical Decision Making  Amount and/or Complexity of Data Reviewed  Labs: ordered.    Exam included above in largely unremarkable with patient in no acute distress and hemodynamically stable on room air  ED Course as of 05/14/23 1834   Sat May 14, 2023   1333 9/18 iron study:  FERRITIN: 11  IRON: 22 (L)  IRON BINDING CAPACITY: 469  IRON SATURATION: 5 (L)  TRANSFERRIN: 335     1424 CBC with diff within normal with a hemoglobin of 11.8   1424 BMP within normal   Recommending discharge home continue to follow-up with PCP to arrange iron infusion center establishment of care.  Patient encouraged to take home iron supplements.  Patient agreeable to plan and verbalized understanding of return precautions.             Clinical Impression   Iron deficiency (Primary)       Disposition: Discharged         I am scribing for, and in the presence of Dr. Rich Brave for services provided on 05/14/2023.  Gomez Cleverly, SCRIBE  05/14/2023, 13:35    I personally performed the services described in this documentation, as scribed  in my presence, and it is both accurate  and complete.    Rich Brave, MD

## 2023-05-17 ENCOUNTER — Ambulatory Visit (INDEPENDENT_AMBULATORY_CARE_PROVIDER_SITE_OTHER): Payer: 59 | Admitting: Student in an Organized Health Care Education/Training Program

## 2023-06-28 ENCOUNTER — Other Ambulatory Visit: Payer: Self-pay

## 2023-06-28 ENCOUNTER — Other Ambulatory Visit (INDEPENDENT_AMBULATORY_CARE_PROVIDER_SITE_OTHER): Payer: Self-pay | Admitting: Student in an Organized Health Care Education/Training Program

## 2023-06-28 DIAGNOSIS — L409 Psoriasis, unspecified: Secondary | ICD-10-CM

## 2023-06-28 DIAGNOSIS — Z79899 Other long term (current) drug therapy: Secondary | ICD-10-CM

## 2023-06-28 DIAGNOSIS — L4050 Arthropathic psoriasis, unspecified: Secondary | ICD-10-CM

## 2023-06-28 NOTE — Telephone Encounter (Signed)
Shelby Cisneros was seen on 05/11/2023 by Dr. Lynnea Maizes for PsA.  Labs were completed on 05/14/2023  and results are listed below.  The next scheduled appointment date is 05/10/2024.  [facility] is requesting a refill on Taltz.  Message has been routed to Dr. Lynnea Maizes.    Lab Results   Component Value Date/Time    WBC 8.0 05/14/2023 01:31 PM    HGB 11.8 05/14/2023 01:31 PM    HCT 37.7 05/14/2023 01:31 PM    PLTCNT 298 05/14/2023 01:31 PM    RBC 4.91 05/14/2023 01:31 PM    MCV 76.8 (L) 05/14/2023 01:31 PM    MCHC 31.3 05/14/2023 01:31 PM    MCH 24.0 (L) 05/14/2023 01:31 PM    MPV 9.8 05/14/2023 01:31 PM      Lab Results   Component Value Date    CREATININE 0.75 05/14/2023      Hepatic Function  Lab Results   Component Value Date    ALBUMIN 3.9 02/15/2023    TOTALPROTEIN 8.0 02/15/2023    ALKPHOS 98 02/15/2023    AST 12 02/15/2023    ALT 10 02/15/2023    BILIRUBINCON 0.1 02/15/2023     Jana Half, LPN  47/03/2955,  11:01

## 2023-06-28 NOTE — Telephone Encounter (Signed)
Specialty Pharmacy Note    Prior Authorization for medication Altamease Oiler has been approved by payor [employer] until 06/27/24.  Approval notice has not been received but if needed can reach out to obtain. Per the Insurance's Requirement the prescription will be sent to [facility] pharmacy.     No further action is needed from clinic    If you have any questions, don't hesitate to contact the Specialty Pharmacy Jani Files, Pharmacy Technician

## 2023-08-03 ENCOUNTER — Telehealth (INDEPENDENT_AMBULATORY_CARE_PROVIDER_SITE_OTHER): Payer: Self-pay | Admitting: Student in an Organized Health Care Education/Training Program

## 2023-08-03 NOTE — Telephone Encounter (Signed)
Spoke with patient on the phone after she left message that she took her last Tremfya today.  Asked for this nurse to call to see if they are going to be sending her next shipment out.  Explained to patient that she needs to call them to set up the shipment as they will want to verify the information and set up a date and time.  Patient voices understanding.    Alfredia Client Eyleen Rawlinson, LPN  01/60/1093,  13:46

## 2023-08-04 ENCOUNTER — Other Ambulatory Visit (HOSPITAL_COMMUNITY): Payer: Self-pay | Admitting: FAMILY PRACTICE

## 2023-08-04 ENCOUNTER — Encounter (HOSPITAL_COMMUNITY): Payer: Self-pay

## 2023-08-04 DIAGNOSIS — Z1231 Encounter for screening mammogram for malignant neoplasm of breast: Secondary | ICD-10-CM

## 2023-08-04 DIAGNOSIS — R5383 Other fatigue: Secondary | ICD-10-CM

## 2023-08-09 ENCOUNTER — Other Ambulatory Visit (HOSPITAL_COMMUNITY): Payer: Self-pay | Admitting: FAMILY PRACTICE

## 2023-08-09 DIAGNOSIS — D649 Anemia, unspecified: Secondary | ICD-10-CM

## 2023-08-31 ENCOUNTER — Ambulatory Visit (INDEPENDENT_AMBULATORY_CARE_PROVIDER_SITE_OTHER): Payer: 59 | Admitting: Mammography

## 2023-09-09 ENCOUNTER — Ambulatory Visit (INDEPENDENT_AMBULATORY_CARE_PROVIDER_SITE_OTHER): Payer: 59 | Admitting: Mammography

## 2023-09-13 ENCOUNTER — Ambulatory Visit (EMERGENCY_DEPARTMENT_HOSPITAL): Payer: 59 | Admitting: Women's Health

## 2023-09-14 ENCOUNTER — Ambulatory Visit (INDEPENDENT_AMBULATORY_CARE_PROVIDER_SITE_OTHER): Payer: 59 | Admitting: Mammography

## 2023-09-15 ENCOUNTER — Other Ambulatory Visit (INDEPENDENT_AMBULATORY_CARE_PROVIDER_SITE_OTHER): Payer: 59 | Admitting: Radiology

## 2023-09-15 ENCOUNTER — Encounter (INDEPENDENT_AMBULATORY_CARE_PROVIDER_SITE_OTHER): Payer: Self-pay | Admitting: Mammography

## 2023-09-15 ENCOUNTER — Other Ambulatory Visit: Payer: Self-pay

## 2023-09-15 ENCOUNTER — Ambulatory Visit (INDEPENDENT_AMBULATORY_CARE_PROVIDER_SITE_OTHER): Payer: 59 | Admitting: Mammography

## 2023-09-15 DIAGNOSIS — Z1231 Encounter for screening mammogram for malignant neoplasm of breast: Secondary | ICD-10-CM

## 2023-09-15 DIAGNOSIS — R5383 Other fatigue: Secondary | ICD-10-CM

## 2023-09-16 ENCOUNTER — Other Ambulatory Visit (HOSPITAL_COMMUNITY): Payer: Self-pay | Admitting: FAMILY PRACTICE

## 2023-09-16 DIAGNOSIS — R928 Other abnormal and inconclusive findings on diagnostic imaging of breast: Secondary | ICD-10-CM

## 2023-10-05 ENCOUNTER — Ambulatory Visit (EMERGENCY_DEPARTMENT_HOSPITAL): Payer: 59 | Admitting: Women's Health

## 2023-10-12 ENCOUNTER — Ambulatory Visit (EMERGENCY_DEPARTMENT_HOSPITAL): Payer: 59 | Admitting: Women's Health

## 2023-10-20 ENCOUNTER — Ambulatory Visit (HOSPITAL_COMMUNITY)
Admission: RE | Admit: 2023-10-20 | Discharge: 2023-10-20 | Disposition: A | Discharge status: Home / Self Care | Payer: 59 | Source: Ambulatory Visit

## 2023-10-20 ENCOUNTER — Other Ambulatory Visit: Payer: Self-pay

## 2023-10-20 ENCOUNTER — Ambulatory Visit
Admission: RE | Admit: 2023-10-20 | Discharge: 2023-10-20 | Disposition: A | Discharge status: Home / Self Care | Payer: 59 | Source: Ambulatory Visit | Attending: FAMILY PRACTICE | Admitting: FAMILY PRACTICE

## 2023-10-20 DIAGNOSIS — R92322 Mammographic fibroglandular density, left breast: Secondary | ICD-10-CM

## 2023-10-20 DIAGNOSIS — R928 Other abnormal and inconclusive findings on diagnostic imaging of breast: Secondary | ICD-10-CM | POA: Insufficient documentation

## 2023-10-20 DIAGNOSIS — N6489 Other specified disorders of breast: Secondary | ICD-10-CM

## 2023-10-27 ENCOUNTER — Other Ambulatory Visit: Payer: Self-pay

## 2023-10-27 ENCOUNTER — Emergency Department (EMERGENCY_DEPARTMENT_HOSPITAL): Payer: Self-pay | Admitting: Women's Health

## 2023-10-27 ENCOUNTER — Other Ambulatory Visit: Attending: Women's Health | Admitting: Women's Health

## 2023-10-27 ENCOUNTER — Ambulatory Visit (EMERGENCY_DEPARTMENT_HOSPITAL): Payer: 59 | Admitting: Women's Health

## 2023-10-27 VITALS — BP 121/81 | HR 87 | Ht 70.0 in | Wt 191.2 lb

## 2023-10-27 DIAGNOSIS — Z113 Encounter for screening for infections with a predominantly sexual mode of transmission: Secondary | ICD-10-CM

## 2023-10-27 DIAGNOSIS — Z7251 High risk heterosexual behavior: Secondary | ICD-10-CM | POA: Insufficient documentation

## 2023-10-27 DIAGNOSIS — L680 Hirsutism: Secondary | ICD-10-CM

## 2023-10-27 LAB — HEPATITIS C ANTIBODY SCREEN WITH REFLEX TO HCV PCR: HCV ANTIBODY QUALITATIVE: NEGATIVE

## 2023-10-27 LAB — SYPHILIS SCREENING ALGORITHM WITH REFLEX, SERUM: SYPHILIS TP ANTIBODIES: NONREACTIVE

## 2023-10-27 LAB — HIV1/HIV2 SCREEN, COMBINED ANTIGEN AND ANTIBODY: HIV SCREEN, COMBINED ANTIGEN & ANTIBODY: NEGATIVE

## 2023-10-27 MED ORDER — SPIRONOLACTONE 100 MG TABLET
100.0000 mg | ORAL_TABLET | Freq: Two times a day (BID) | ORAL | 3 refills | Status: AC
Start: 2023-10-27 — End: ?

## 2023-10-27 NOTE — Progress Notes (Signed)
 OB/GYN, [facility]  [address]  [place] [address]       Name: Shelby Cisneros MRN:  [id number]   Date: 10/27/2023 Age: 48 y.o.        Chief Complaint   Patient presents with    Gyn Exam       History of Present Illness: Shelby Cisneros is a 48 y.o. female who presents to the office today for the above complaint. Is here for STI testing, is not having any symptoms just wants to be safe. Had a pap done at [facility] and was told she had a polyp that needs to be removed. Is also concerned about facial/chin hair. Has to shave and pluck every single day. Reports as far as she  know the pap was normal. Has no other concerns for today's visit.   HPI    Past Medical History:    Past Medical History:   Diagnosis Date    PCOS (polycystic ovarian syndrome)          Past Surgical History:    Past Surgical History:   Procedure Laterality Date    GALLBLADDER SURGERY  2000    gallstones    HX GASTRIC BYPASS  2014    weight loss         Allergies:  No Known Allergies  Medications:  Outpatient Medications Marked as Taking for the 10/27/23 encounter (Office Visit) with Garth Bigness, APRN,WHNP-BC   Medication Sig    sertraline (ZOLOFT) 100 mg Oral Tablet Take 1 Tablet (100 mg total) by mouth Once a day    spironolactone (ALDACTONE) 100 mg Oral Tablet Take 1 Tablet (100 mg total) by mouth Twice daily    TALTZ AUTOINJECTOR 80 mg/mL Subcutaneous Auto-Injector INJECT 1 ML (80 MG TOTAL) UNDER THE SKIN EVERY 28 DAYS     Social History:    Social History     Socioeconomic History    Marital status: Single   Tobacco Use    Smoking status: Every Day     Types: Cigarettes    Smokeless tobacco: Never   Vaping Use    Vaping status: Never Used   Substance and Sexual Activity    Alcohol use: Never    Drug use: Never    Sexual activity: Yes     Partners: Male     Birth control/protection: None     Family History:  Family Medical History:       Problem Relation (Age of Onset)    Cancer Mother    Diabetes Father     Parkinsons Disease Father            Review of Systems:  Review of Systems   HENT:          Facial hair    Genitourinary:         Endocervical polyp   All other systems reviewed and are negative.    Physical Exam:  Vitals:    10/27/23 1007   BP: 121/81   Pulse: 87   Weight: 86.7 kg (191 lb 3.2 oz)   Height: 1.778 m (5\' 10" )   BMI: 27.43         Body mass index is 27.43 kg/m.  Physical Exam  Constitutional:       Appearance: Normal appearance.   Genitourinary:      Right Labia: No rash, tenderness, lesions, skin changes or Bartholin's cyst.     Left Labia: No tenderness, lesions, skin changes, Bartholin's cyst  or rash.     No vaginal discharge, erythema or tenderness.      Cervical polyp present.         HENT:      Head: Normocephalic and atraumatic.        Nose: Nose normal.      Mouth/Throat:      Mouth: Mucous membranes are moist.   Eyes:      Extraocular Movements: Extraocular movements intact.      Pupils: Pupils are equal, round, and reactive to light.   Pulmonary:      Effort: Pulmonary effort is normal.   Musculoskeletal:         General: Normal range of motion.      Cervical back: Normal range of motion.   Neurological:      General: No focal deficit present.      Mental Status: She is alert and oriented to person, place, and time.   Skin:     General: Skin is warm and dry.   Psychiatric:         Mood and Affect: Mood normal.         Behavior: Behavior normal.         Thought Content: Thought content normal.         Judgment: Judgment normal.   Vitals reviewed.        Data Reviewed:    Assessment:   Assessment/Plan   1. Routine screening for STI (sexually transmitted infection)    2. Unprotected sex    3. Hirsutism      Plan:      Orders Placed This Encounter    CHLAMYDIA TRACHOMATIS/NEISSERIA GONORRHOEAE RNA, NAAT    HIV1/HIV2 SCREEN, COMBINED ANTIGEN AND ANTIBODY    HEPATITIS C ANTIBODY SCREEN WITH REFLEX TO HCV PCR    SYPHILIS SCREENING ALGORITHM WITH REFLEX, SERUM    spironolactone (ALDACTONE) 100 mg  Oral Tablet       STD testing will be back in a few days and we will call with results all positive STDs are reported to the [facility] Dept per [facility] law. The best way to protect yourself from STDs is reducing number of partners and 100% condom use     Explained to pt unable to remove endocervical polyp due to small size and unable to grasp polyp  Start Spironolactone for facial hair   {Return in about 3 months (around 01/27/2024) for med check.    Garth Bigness, APRN,WHNP-BC

## 2023-10-27 NOTE — Nursing Note (Signed)
 Venipuncture performed in office, labs obtained from [facility]. Pressure dressing applied to site, patient tolerated well. Shelby Cisneros, [place]

## 2023-10-28 LAB — CHLAMYDIA TRACHOMATIS/NEISSERIA GONORRHOEAE RNA, NAAT
CHLAMYDIA TRACHOMATIS RNA: NEGATIVE
NEISSERIA GONORRHEA GC RNA: NEGATIVE

## 2023-11-01 ENCOUNTER — Ambulatory Visit (EMERGENCY_DEPARTMENT_HOSPITAL): Payer: Self-pay | Admitting: Women's Health

## 2024-02-20 ENCOUNTER — Encounter (HOSPITAL_COMMUNITY): Payer: Self-pay | Admitting: Internal Medicine

## 2024-02-20 ENCOUNTER — Other Ambulatory Visit: Payer: Self-pay

## 2024-02-20 ENCOUNTER — Ambulatory Visit: Payer: Self-pay | Attending: Internal Medicine | Admitting: Internal Medicine

## 2024-02-20 ENCOUNTER — Ambulatory Visit (HOSPITAL_COMMUNITY)
Admission: RE | Admit: 2024-02-20 | Discharge: 2024-02-20 | Disposition: A | Discharge status: Home / Self Care | Source: Ambulatory Visit

## 2024-02-20 VITALS — BP 119/83 | HR 72 | Temp 97.2°F | Resp 16 | Ht 70.0 in | Wt 181.2 lb

## 2024-02-20 DIAGNOSIS — D649 Anemia, unspecified: Secondary | ICD-10-CM

## 2024-02-20 DIAGNOSIS — Z9884 Bariatric surgery status: Secondary | ICD-10-CM | POA: Insufficient documentation

## 2024-02-20 DIAGNOSIS — D509 Iron deficiency anemia, unspecified: Secondary | ICD-10-CM | POA: Insufficient documentation

## 2024-02-20 LAB — CBC WITH DIFF
BASOPHIL #: <0.1 x10ˆ3/uL (ref <=0.20)
BASOPHIL %: 0.7 %
EOSINOPHIL #: 0.14 x10ˆ3/uL (ref <=0.50)
EOSINOPHIL %: 1.9 %
HCT: 36.9 % (ref 34.8–46.0)
HGB: 10.9 g/dL — ABNORMAL LOW (ref 11.5–16.0)
IMMATURE GRANULOCYTE #: <0.1 x10ˆ3/uL (ref <0.10)
IMMATURE GRANULOCYTE %: 0.4 % (ref 0.0–1.0)
LYMPHOCYTE #: 2.05 x10ˆ3/uL (ref 1.00–4.80)
LYMPHOCYTE %: 27.2 %
MCH: 21.8 pg — ABNORMAL LOW (ref 26.0–32.0)
MCHC: 29.5 g/dL — ABNORMAL LOW (ref 31.0–35.5)
MCV: 73.9 fL — ABNORMAL LOW (ref 78.0–100.0)
MONOCYTE #: 0.53 x10ˆ3/uL (ref 0.20–1.10)
MONOCYTE %: 7 %
MPV: 9.6 fL (ref 8.7–12.5)
NEUTROPHIL #: 4.74 x10ˆ3/uL (ref 1.50–7.70)
NEUTROPHIL %: 62.8 %
PLATELETS: 330 x10ˆ3/uL (ref 150–400)
RBC: 4.99 10*6/uL (ref 3.85–5.22)
RDW-CV: 20.5 % — ABNORMAL HIGH (ref 11.5–15.5)
WBC: 7.5 x10ˆ3/uL (ref 3.7–11.0)

## 2024-02-20 LAB — IRON TRANSFERRIN AND TIBC
IRON (TRANSFERRIN) SATURATION: 3 % — ABNORMAL LOW (ref 15–50)
IRON: 11 ug/dL — ABNORMAL LOW (ref 45–170)
TOTAL IRON BINDING CAPACITY: 437 ug/dL (ref 252–504)
TRANSFERRIN: 312 mg/dL (ref 180–360)

## 2024-02-20 LAB — VITAMIN B12: VITAMIN B 12: 201 pg/mL (ref 200–900)

## 2024-02-20 LAB — FERRITIN: FERRITIN: 9 ng/mL (ref 5–200)

## 2024-02-20 NOTE — Progress Notes (Addendum)
 ONCOLOGY CLINIC, [facility]  [address]                                           [place] [address]        Encounter Date: 02/20/2024   2:30 PM EDT    Name:  Shelby Cisneros  Age: 48 y.o.  DOB: Feb 27, 1976  Sex: female  PCP: Norleen Nodal II, DO    Chief Complaint:    Chief Complaint   Patient presents with    New Patient         HISTORY OF PRESENT ILLNESS:   48 year old female with a history of gastric bypass surgery who presents for evaluation of ongoing iron deficiency anemia. She reports that anemia has been a persistent issue since her bariatric surgery. She has undergone EGD and colonoscopy in the past. She previously received multiple IV iron infusions at an outside facility, with the most recent infusion administered approximately one year ago. She denies any current gastrointestinal bleeding, including hematochezia, melena, or black/tarry stools.    Laboratory Review:  February 15, 2023: WBC 9.9, Hemoglobin 12.9, Platelets 333  May 14, 2023: WBC 8.0, Hemoglobin 11.8, Platelets 298  August 02, 2023: WBC 8.1, Hemoglobin 11.8, MCV 75  Iron: 14  TIBC: 440  Iron Saturation: 3%  Findings consistent with microcytic, iron deficiency anemia.        REVIEW OF SYSTEMS:  GENERAL:  no fevers, chills  All other review ROS negative except those mentioned above.     PATIENT HISTORY:  Past Medical History:   Diagnosis Date    PCOS (polycystic ovarian syndrome)          Past Surgical History:   Procedure Laterality Date    GALLBLADDER SURGERY  2000    gallstones    HX GASTRIC BYPASS  2014    weight loss         Family Medical History:       Problem Relation (Age of Onset)    Cancer Mother    Diabetes Father    Parkinsons Disease Father            Current Outpatient Medications   Medication Sig    sertraline (ZOLOFT) 100 mg Oral Tablet Take 1 Tablet (100 mg total) by mouth Once a day    spironolactone  (ALDACTONE ) 100 mg Oral Tablet Take 1 Tablet (100 mg total) by mouth Twice daily    TALTZ   AUTOINJECTOR 80 mg/mL Subcutaneous Auto-Injector INJECT 1 ML (80 MG TOTAL) UNDER THE SKIN EVERY 28 DAYS     Social History     Socioeconomic History    Marital status: Single   Tobacco Use    Smoking status: Every Day     Types: Cigarettes    Smokeless tobacco: Never   Vaping Use    Vaping status: Never Used   Substance and Sexual Activity    Alcohol use: Never    Drug use: Never    Sexual activity: Yes     Partners: Male     Birth control/protection: None       PHYSICAL EXAMINATION:  VITALS: BP 119/83   Pulse 72   Temp 36.2 C (97.2 F) (Temporal)   Resp 16   Ht 1.778 m (5' 10)   Wt 82.2 kg (181 lb 3.5 oz)   SpO2 98%   BMI 26.00 kg/m  PHYSICAL EXAM:   Consitutional: No acute distress.    Eyes: EOMI. No discharge. No Jaundice.   ENT: mucous membranes dry. No posterior pharynx lesions.. Neck supple, No palpable masses   Heme/ Lymph: No Cervical, Inguinal, axillary lymh nodes. No Bruising.   Cardiovascular: S1, S2, No murmurs, rubs, or gallops.   Respiratory: Clear to auscultate and percuss B/L, No rhonchi, crackles, or wheezing.   Abdomen: Normal Bowel Sounds, nontender nondistended. No hepatosplenomegaly.   Musculoskeletal: No Edema to the extremities.   Skin: Normal turgor. No Rashes,skin lesions   Psychiatry: Normal Affect   Neuro: No focal deficits. Alert and Oriented x 3      ENCOUNTER ORDERS:  Orders Placed This Encounter    CBC/DIFF    FERRITIN    IRON TRANSFERRIN AND TIBC    VITAMIN B12       LABORATORY/RADIOLOGICAL DATA: All pertinent labs/radiology were reviewed.     ASSESSMENT AND PLAN:  Encounter Diagnoses   Name Primary?    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 No signs of active gastrointestinal bleeding. Iron studies confirm significant iron depletion with low iron, elevated TIBC, and very low saturation.    Plan/Recommendations:  Repeat CBC with  differential.  Repeat iron panel including ferritin, iron, TIBC, and transferrin saturation.  If persistent iron deficiency anemia is confirmed, proceed with IV iron infusion.  Side effects of IV iron infusion explained in detail.  Age-appropriate health maintenance screenings per primary care provider.  Patient to obtain and bring report of prior colonoscopy for review.    Follow-up:  Pending repeat labs. Reassess after results to determine need for IV iron therapy.      Rocky Lash, MD  Hematology/Oncology

## 2024-02-23 ENCOUNTER — Encounter (HOSPITAL_COMMUNITY): Payer: Self-pay | Admitting: Internal Medicine

## 2024-02-27 ENCOUNTER — Encounter (HOSPITAL_COMMUNITY): Payer: Self-pay | Admitting: Internal Medicine

## 2024-03-02 ENCOUNTER — Encounter (HOSPITAL_COMMUNITY): Payer: Self-pay | Admitting: Internal Medicine

## 2024-03-05 ENCOUNTER — Encounter (HOSPITAL_COMMUNITY): Payer: Self-pay | Admitting: Internal Medicine

## 2024-03-06 ENCOUNTER — Ambulatory Visit
Admission: RE | Admit: 2024-03-06 | Discharge: 2024-03-06 | Disposition: A | Discharge status: Home / Self Care | Source: Ambulatory Visit | Attending: Internal Medicine | Admitting: Internal Medicine

## 2024-03-06 ENCOUNTER — Other Ambulatory Visit: Payer: Self-pay

## 2024-03-06 VITALS — BP 109/70 | HR 89 | Temp 97.2°F | Resp 18 | Wt 180.0 lb

## 2024-03-06 DIAGNOSIS — D649 Anemia, unspecified: Secondary | ICD-10-CM | POA: Insufficient documentation

## 2024-03-06 MED ORDER — SODIUM CHLORIDE 0.9% FLUSH BAG - 250 ML
INTRAVENOUS | Status: DC | PRN
Start: 2024-03-06 — End: 2024-03-07
  Administered 2024-03-06: 0 mL via INTRAVENOUS

## 2024-03-06 MED ORDER — SODIUM CHLORIDE 0.9 % INTRAVENOUS SOLUTION
750.0000 mg | Freq: Once | INTRAVENOUS | Status: AC
Start: 2024-03-06 — End: 2024-03-06
  Administered 2024-03-06: 0 mg via INTRAVENOUS
  Administered 2024-03-06: 750 mg via INTRAVENOUS
  Filled 2024-03-06: qty 15

## 2024-03-06 NOTE — Nurses Notes (Addendum)
 9166- PIV flushed with 3 mL NS, +BR. Injectafer  infusing at 20 minute rate. Ileana Hutchinson, RN  0901-Infusion complete. PIV flushed with 3 ml normal saline. +BR. Tolerated treatment well. VS 115/70, 77, 18, 100% RA. Declines observation. PIV dc'd intact and gauze/coban applied to the site. Aware of next appointment. Patient left ambulatory without incident. Sari Northern, RN

## 2024-03-07 ENCOUNTER — Encounter (HOSPITAL_COMMUNITY): Payer: Self-pay | Admitting: Internal Medicine

## 2024-03-12 ENCOUNTER — Encounter (HOSPITAL_COMMUNITY): Payer: Self-pay | Admitting: Internal Medicine

## 2024-03-13 ENCOUNTER — Other Ambulatory Visit: Payer: Self-pay

## 2024-03-13 ENCOUNTER — Ambulatory Visit
Admission: RE | Admit: 2024-03-13 | Discharge: 2024-03-13 | Disposition: A | Discharge status: Home / Self Care | Source: Ambulatory Visit | Attending: Internal Medicine | Admitting: Internal Medicine

## 2024-03-13 ENCOUNTER — Encounter (HOSPITAL_COMMUNITY): Payer: Self-pay | Admitting: Internal Medicine

## 2024-03-13 VITALS — BP 112/72 | HR 73 | Temp 96.8°F | Resp 18

## 2024-03-13 DIAGNOSIS — D649 Anemia, unspecified: Secondary | ICD-10-CM | POA: Insufficient documentation

## 2024-03-13 MED ORDER — SODIUM CHLORIDE 0.9 % INTRAVENOUS SOLUTION
750.0000 mg | Freq: Once | INTRAVENOUS | Status: AC
Start: 2024-03-13 — End: 2024-03-13
  Administered 2024-03-13: 0 mg via INTRAVENOUS
  Administered 2024-03-13: 750 mg via INTRAVENOUS
  Filled 2024-03-13: qty 15

## 2024-03-13 MED ORDER — SODIUM CHLORIDE 0.9% FLUSH BAG - 250 ML
INTRAVENOUS | Status: DC | PRN
Start: 2024-03-13 — End: 2024-03-14
  Administered 2024-03-13: 0 mL via INTRAVENOUS

## 2024-03-13 NOTE — Nurses Notes (Addendum)
 0843-PIV flushed with NS 10ml with +BR. Injectafer  started at a rate.Alm Sayres, RN  0903-Infusion complete. Denies complaints. BP 112/72   Pulse 73   Temp 36 C (96.8 F)   Resp 18   SpO2 100% Pt tolerated treatment well. PIV flushed with NS 10ml with +BR. Declines wait time. PIV d/c intact from healthy site with 2x2 and coban applied. Patient left ambulatory.Alm Sayres, RN

## 2024-05-10 ENCOUNTER — Ambulatory Visit (INDEPENDENT_AMBULATORY_CARE_PROVIDER_SITE_OTHER): Payer: 59 | Admitting: Student in an Organized Health Care Education/Training Program

## 2024-05-21 ENCOUNTER — Other Ambulatory Visit: Payer: Self-pay

## 2024-05-21 DIAGNOSIS — D649 Anemia, unspecified: Secondary | ICD-10-CM

## 2024-05-22 ENCOUNTER — Ambulatory Visit (HOSPITAL_COMMUNITY): Payer: Self-pay

## 2024-05-24 ENCOUNTER — Other Ambulatory Visit: Payer: Self-pay

## 2024-05-24 ENCOUNTER — Ambulatory Visit
Attending: Student in an Organized Health Care Education/Training Program | Admitting: Student in an Organized Health Care Education/Training Program

## 2024-05-24 ENCOUNTER — Encounter (INDEPENDENT_AMBULATORY_CARE_PROVIDER_SITE_OTHER): Payer: Self-pay | Admitting: Student in an Organized Health Care Education/Training Program

## 2024-05-24 VITALS — BP 112/82 | HR 70 | Temp 97.1°F | Ht 70.0 in | Wt 181.0 lb

## 2024-05-24 DIAGNOSIS — Z79899 Other long term (current) drug therapy: Secondary | ICD-10-CM | POA: Insufficient documentation

## 2024-05-24 DIAGNOSIS — L4050 Arthropathic psoriasis, unspecified: Secondary | ICD-10-CM | POA: Insufficient documentation

## 2024-05-24 DIAGNOSIS — L409 Psoriasis, unspecified: Secondary | ICD-10-CM | POA: Insufficient documentation

## 2024-05-24 MED ORDER — TALTZ AUTOINJECTOR 80 MG/ML SUBCUTANEOUS
80.0000 mg | AUTO-INJECTOR | SUBCUTANEOUS | 3 refills | Status: DC
Start: 2024-05-24 — End: 2024-05-29
  Filled 2024-05-24: qty 1, 28d supply, fill #0

## 2024-05-24 NOTE — Progress Notes (Signed)
 [facility]  [address]  [name] [facility] [address]    HPI:  This is a case of a 48 y.o. year old female who comes in today for follow-up of psoriatic arthritis.    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  Denied having any active symptoms of psoriatic arthritis.  She has some mild dryness over the palms of her hands but otherwise no skin lesions.  No issues receiving the medication.      Initial HPI listed below  Patient doing well. Hx of dactylitis involving the fingers. Stiffness lasting all day prior to starting Tremfya. Symptoms initially came on around 2 years. Her skin is around 90% improved. No uveitis symptoms. AM stiffness less than 30 minutes currently.       Denies having any shortness of breath, chest pain, abdominal pain, fevers, unintential weight loss.    All systems reviewed and are otherwise unremarkable unless stated above.    Social Hx- no alcohol use, 1/2 pack smoking (habit) , resides in [place], [place]  Family Hx- no family hx of PsO    Current Outpatient Medications   Medication Sig Dispense Refill    ixekizumab  (TALTZ  AUTOINJECTOR) 80 mg/mL Subcutaneous Auto-Injector Inject 1 mL (80 mg total) under the skin Every 28 days 3 mL 3    sertraline (ZOLOFT) 100 mg Oral Tablet Take 1 Tablet (100 mg total) by mouth Once a day (Patient not taking: Reported on 05/24/2024)      spironolactone  (ALDACTONE ) 100 mg Oral Tablet Take 1 Tablet (100 mg total) by mouth Twice daily 60 Tablet 3     No current facility-administered medications for this visit.     No Known Allergies      Past Surgical History:   Procedure Laterality Date    GALLBLADDER SURGERY   2000    gallstones    HX GASTRIC BYPASS  2014    weight loss       PHYSICAL EXAM:  VITALS:   BP 112/82   Pulse 70   Temp 36.2 C (97.1 F) (Thermal Scan)   Ht 1.778 m (5' 10)   Wt 82.1 kg (181 lb)   SpO2 99%   BMI 25.97 kg/m       General- well appearing woman resting comfortably in chair  Neurologic- no focal deficits noted, speaks in full sentences and answers questions appropriately   HEENT- non traumatic skull, no scleral icterus, normal appearing oral mucosa,   Skin- dry erythematous skin noted over palms of hands bilaterally,  Musculoskeletal- no synovitis, no dactylitis, no enthesitis, 5/5 muscle strength in upper and lower extremities  Psych- appropriate mood and corresponding affect     Labs/imaging:  Labs reviewed from 02/20/2024   WBC 7.5, hemoglobin 11, platelet count 330       Assessment and Plan:  48 y.o. year old female presents today for management of psoriatic arthritis.  Patient was currently being prescribed Taltz  for her skin and joint disease with patient being in remission.  Recommend continuation of Taltz .  Follow up 1 year sooner if  needed.  Recommend obtaining routine CMP non urgently in the next 1-2 months.    1. PSA (psoriatic arthritis) (Primary)  - in remission; continue Taltz   - COMPREHENSIVE METABOLIC PANEL, NON-FASTING; Future  - ixekizumab  (TALTZ  AUTOINJECTOR) 80 mg/mL Subcutaneous Auto-Injector; Inject 1 mL (80 mg total) under the skin Every 28 days  Dispense: 3 mL; Refill: 3    2. Psoriasis  - ixekizumab  (TALTZ  AUTOINJECTOR) 80 mg/mL Subcutaneous Auto-Injector; Inject 1 mL (80 mg total) under the skin Every 28 days  Dispense: 3 mL; Refill: 3    3. High risk medication use  - COMPREHENSIVE METABOLIC PANEL, NON-FASTING; Future  - ixekizumab  (TALTZ  AUTOINJECTOR) 80 mg/mL Subcutaneous Auto-Injector; Inject 1 mL (80 mg total) under the skin Every 28 days  Dispense: 3 mL; Refill: 3      Return in about 1 year (around 05/24/2025) for In Person Visit, PsA.    Sanii Kukla,  MD

## 2024-05-28 ENCOUNTER — Other Ambulatory Visit: Payer: Self-pay

## 2024-05-28 NOTE — Telephone Encounter (Signed)
 Specialty Pharmacy Note    Prior Authorization for medication Taltz  has been approved by payor [employer] until 05/28/2025.  Approval notice has not been received but if needed can reach out to obtain. Per the Insurance's Requirement the prescription will be sent to [facility]. (Script sent to [facility] pharm placed on hold)    This signed encounter has been routed to the provider's office noting the completion of prior authorization and informing that further action is needed from clinic due to please send taltz  script to [facility]    If you have any questions, don't hesitate to contact the Specialty Pharmacy Lum Free, Pharmacy Technician

## 2024-05-28 NOTE — Telephone Encounter (Signed)
 Prior authorization for Taltz  submitted electronically on 05/28/2024 through [facility] Portal. Key [id number]. Waiting for response from payor.  -reauth, fills through [facility]    Rakia Frayne, Pharmacy Technician

## 2024-05-29 ENCOUNTER — Other Ambulatory Visit (INDEPENDENT_AMBULATORY_CARE_PROVIDER_SITE_OTHER): Payer: Self-pay | Admitting: Student in an Organized Health Care Education/Training Program

## 2024-05-29 DIAGNOSIS — L4050 Arthropathic psoriasis, unspecified: Secondary | ICD-10-CM

## 2024-05-29 DIAGNOSIS — Z79899 Other long term (current) drug therapy: Secondary | ICD-10-CM

## 2024-05-29 DIAGNOSIS — L409 Psoriasis, unspecified: Secondary | ICD-10-CM

## 2024-05-29 MED ORDER — TALTZ AUTOINJECTOR 80 MG/ML SUBCUTANEOUS
80.0000 mg | AUTO-INJECTOR | SUBCUTANEOUS | 3 refills | Status: AC
Start: 2024-05-29 — End: ?

## 2024-05-29 NOTE — Telephone Encounter (Signed)
 Prior Authorization for medication Taltz  has been approved by payor [employer] until 05/28/2025.  Approval notice has not been received but if needed can reach out to obtain. Per the Insurance's Requirement the prescription will be sent to [facility] pharmacy. (Script sent to [facility] pharm placed on hold)     This signed encounter has been routed to the provider's office noting the completion of prior authorization and informing that further action is needed from clinic due to please send taltz  script to [facility]    Please forward new script to [facility] pharmacy.  Message routed to Dr Froylan    Shelby Valora Lesser, LPN  89/09/7972,  10:10

## 2024-05-30 ENCOUNTER — Encounter (HOSPITAL_COMMUNITY): Payer: Self-pay

## 2024-05-30 ENCOUNTER — Other Ambulatory Visit: Payer: Self-pay

## 2024-05-30 ENCOUNTER — Ambulatory Visit (HOSPITAL_COMMUNITY)
Admission: RE | Admit: 2024-05-30 | Discharge: 2024-05-30 | Disposition: A | Discharge status: Home / Self Care | Source: Ambulatory Visit

## 2024-05-30 ENCOUNTER — Ambulatory Visit

## 2024-05-30 VITALS — HR 73 | Temp 97.7°F | Resp 16 | Ht 70.0 in | Wt 180.0 lb

## 2024-05-30 DIAGNOSIS — D649 Anemia, unspecified: Secondary | ICD-10-CM

## 2024-05-30 DIAGNOSIS — D508 Other iron deficiency anemias: Secondary | ICD-10-CM | POA: Insufficient documentation

## 2024-05-30 DIAGNOSIS — Z9884 Bariatric surgery status: Secondary | ICD-10-CM | POA: Insufficient documentation

## 2024-05-30 DIAGNOSIS — D509 Iron deficiency anemia, unspecified: Secondary | ICD-10-CM

## 2024-05-30 LAB — CBC WITH DIFF
BASOPHIL #: <0.1 x10ˆ3/uL (ref <=0.20)
BASOPHIL %: 0.5 %
EOSINOPHIL #: 0.21 x10ˆ3/uL (ref <=0.50)
EOSINOPHIL %: 3.4 %
HCT: 47.4 % — ABNORMAL HIGH (ref 34.8–46.0)
HGB: 15.7 g/dL (ref 11.5–16.0)
IMMATURE GRANULOCYTE #: <0.1 x10ˆ3/uL (ref <0.10)
IMMATURE GRANULOCYTE %: 0.2 % (ref 0.0–1.0)
LYMPHOCYTE #: 1.51 x10ˆ3/uL (ref 1.00–4.80)
LYMPHOCYTE %: 24.2 %
MCH: 30 pg (ref 26.0–32.0)
MCHC: 33.1 g/dL (ref 31.0–35.5)
MCV: 90.6 fL (ref 78.0–100.0)
MONOCYTE #: 0.47 x10ˆ3/uL (ref 0.20–1.10)
MONOCYTE %: 7.5 %
MPV: 9.2 fL (ref 8.7–12.5)
NEUTROPHIL #: 4.02 x10ˆ3/uL (ref 1.50–7.70)
NEUTROPHIL %: 64.2 %
PLATELETS: 191 x10ˆ3/uL (ref 150–400)
RBC: 5.23 x10ˆ6/uL — ABNORMAL HIGH (ref 3.85–5.22)
RDW-CV: 19 % — ABNORMAL HIGH (ref 11.5–15.5)
WBC: 6.3 x10ˆ3/uL (ref 3.7–11.0)

## 2024-05-30 LAB — FERRITIN: FERRITIN: 87 ng/mL (ref 5–200)

## 2024-05-30 LAB — IRON TRANSFERRIN AND TIBC
IRON (TRANSFERRIN) SATURATION: 41 % (ref 15–50)
IRON: 147 ug/dL (ref 45–170)
TOTAL IRON BINDING CAPACITY: 358 ug/dL (ref 252–504)
TRANSFERRIN: 256 mg/dL (ref 180–360)

## 2024-05-30 LAB — PLATELETS AND ANC CANCER CENTER
NEUTROPHILS #: 4.02 x10ˆ3/uL (ref 1.50–7.70)
PLATELET COUNT: 191 x10ˆ3/uL (ref 150–400)

## 2024-05-30 NOTE — Progress Notes (Signed)
 ONCOLOGY CLINIC, [facility]  [address]                                           [place] [address]        Encounter Date: 05/30/2024   9:30 AM EDT    Name:  Shelby Cisneros  Age: 48 y.o.  DOB: Oct 10, 1975  Sex: female  PCP: Norleen Nodal II, DO    Chief Complaint:    Chief Complaint   Patient presents with    Follow Up         HISTORY OF PRESENT ILLNESS:   48 y.o. female with a history of gastric bypass surgery who presents for evaluation of ongoing iron  deficiency anemia. She reports that anemia has been a persistent issue since her bariatric surgery. She has undergone EGD and colonoscopy in the past. She previously received multiple IV iron  infusions at an outside facility, with the most recent infusion administered approximately one year ago. She denies any current gastrointestinal bleeding, including hematochezia, melena, or black/tarry stools.    Laboratory Review:  February 15, 2023: WBC 9.9, Hemoglobin 12.9, Platelets 333  May 14, 2023: WBC 8.0, Hemoglobin 11.8, Platelets 298  August 02, 2023: WBC 8.1, Hemoglobin 11.8, MCV 75  Iron : 14  TIBC: 440  Iron  Saturation: 3%  Findings consistent with microcytic, iron  deficiency anemia.  Subjective: Patient presents for follow up. She states she has been doing much better since her injectafer  infusions. Denies excessive fatigue, ice cravings, and restless legs at this time. Denies constitutional symptoms.       REVIEW OF SYSTEMS:  GENERAL:  no fevers, chills  All other review ROS negative except those mentioned above.     PATIENT HISTORY:  Past Medical History:   Diagnosis Date    PCOS (polycystic ovarian syndrome)          Past Surgical History:   Procedure Laterality Date    GALLBLADDER SURGERY  2000    gallstones    HX GASTRIC BYPASS  2014    weight loss         Family Medical History:       Problem Relation (Age of Onset)    Cancer Mother    Diabetes Father    Parkinsons Disease Father            Current Outpatient Medications    Medication Sig    ixekizumab  (TALTZ  AUTOINJECTOR) 80 mg/mL Subcutaneous Auto-Injector Inject 1 pen (80 mg) subcutaneously (under the skin) once every 4 weeks    sertraline (ZOLOFT) 100 mg Oral Tablet Take 1 Tablet (100 mg total) by mouth Once a day    spironolactone  (ALDACTONE ) 100 mg Oral Tablet Take 1 Tablet (100 mg total) by mouth Twice daily     Social History     Socioeconomic History    Marital status: Single   Tobacco Use    Smoking status: Every Day     Types: Cigarettes    Smokeless tobacco: Never   Vaping Use    Vaping status: Never Used   Substance and Sexual Activity    Alcohol use: Never    Drug use: Never    Sexual activity: Yes     Partners: Male     Birth control/protection: None       PHYSICAL EXAMINATION:  VITALS: Pulse 73   Temp 36.5 C (97.7  F)   Resp 16   Ht 1.778 m (5' 10)   Wt 81.6 kg (180 lb)   SpO2 100%   BMI 25.83 kg/m         PHYSICAL EXAM:   Consitutional: No acute distress.    Eyes: EOMI. No discharge. No Jaundice.   ENT: mucous membranes dry. No posterior pharynx lesions.. Neck supple, No palpable masses   Heme/ Lymph: No Cervical, Inguinal, axillary lymh nodes. No Bruising.   Cardiovascular: S1, S2, No murmurs, rubs, or gallops.   Respiratory: Clear to auscultate and percuss B/L, No rhonchi, crackles, or wheezing.   Abdomen: Normal Bowel Sounds, nontender nondistended. No hepatosplenomegaly.   Musculoskeletal: No Edema to the extremities.   Skin: Normal turgor. No Rashes,skin lesions   Psychiatry: Normal Affect   Neuro: No focal deficits. Alert and Oriented x 3      ENCOUNTER ORDERS:  Orders Placed This Encounter    IRON  TRANSFERRIN AND TIBC    FERRITIN    COMPREHENSIVE METABOLIC PANEL, NON-FASTING    IRON  TRANSFERRIN AND TIBC    FERRITIN       LABORATORY/RADIOLOGICAL DATA: All pertinent labs/radiology were reviewed.    05/30/24 09:02   WBC 6.3   HGB 15.7   HCT 47.4 (H)   PLATELET COUNT (AUTO) 191   PLATELET COUNT 191   RBC 5.23 (H)   MCV 90.6   MCHC 33.1   MCH 30.0   RDW-CV  19.0 (H)   MPV 9.2   PMN'S 64.2   LYMPHOCYTES 24.2   EOSINOPHIL 3.4   MONOCYTES 7.5   BASOPHILS 0.5   IMMATURE GRANULOCYTE % 0.2   IMMATURE GRANULOCYTE # <0.10   PMN ABS (AUTO) 4.02   PMN ABS 4.02   LYMPHS ABS 1.51   EOS ABS 0.21   MONOS ABS 0.47   BASOS ABS <0.10   FERRITIN 87   IRON  147   IRON  BINDING CAPACITY 358   IRON  SATURATION 41   TRANSFERRIN 256   (H): Data is abnormally high  ASSESSMENT AND PLAN:  Encounter Diagnoses   Name Primary?    Iron  deficiency anemia secondary to inadequate dietary iron  intake Yes     48 y.o. 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 No signs of active gastrointestinal bleeding. Iron  studies confirm significant iron  depletion with low iron , elevated TIBC, and very low saturation.    Plan/Recommendations:  Repeat CBC with differential demonstrates much improved H&H  Repeat iron  panel including ferritin, iron , TIBC, and transferrin saturation much improved as well  If persistent iron  deficiency anemia is confirmed, proceed with repeat IV iron  infusion.  Side effects of IV iron  infusion explained in detail.  Age-appropriate health maintenance screenings per primary care provider.  Patient to obtain and bring report of prior colonoscopy for review.  NO IV iron  necessary for now  RTC in 6 months with labs only visit in 3 to determine continued need for IV iron           Leisa Sorrel, PA-C   [facility]

## 2024-08-01 ENCOUNTER — Other Ambulatory Visit (HOSPITAL_COMMUNITY): Payer: Self-pay | Admitting: FAMILY PRACTICE

## 2024-08-01 DIAGNOSIS — Z1231 Encounter for screening mammogram for malignant neoplasm of breast: Secondary | ICD-10-CM

## 2024-08-30 ENCOUNTER — Ambulatory Visit (HOSPITAL_COMMUNITY): Payer: Self-pay

## 2024-09-19 ENCOUNTER — Other Ambulatory Visit (HOSPITAL_COMMUNITY): Payer: Self-pay | Admitting: FAMILY PRACTICE

## 2024-09-19 DIAGNOSIS — R928 Other abnormal and inconclusive findings on diagnostic imaging of breast: Secondary | ICD-10-CM

## 2024-09-21 ENCOUNTER — Ambulatory Visit

## 2024-11-28 ENCOUNTER — Ambulatory Visit (HOSPITAL_COMMUNITY): Payer: Self-pay

## 2025-05-24 ENCOUNTER — Ambulatory Visit (INDEPENDENT_AMBULATORY_CARE_PROVIDER_SITE_OTHER): Admitting: Student in an Organized Health Care Education/Training Program
